# Patient Record
Sex: Female | Born: 1985 | Race: Black or African American | Hispanic: No | Marital: Married | Smoking: Never smoker
Health system: Southern US, Community
[De-identification: ages and names within clinical notes are randomized; demographics above are authoritative.]

## PROBLEM LIST (undated history)

## (undated) ENCOUNTER — Inpatient Hospital Stay (HOSPITAL_COMMUNITY): Payer: Self-pay

## (undated) DIAGNOSIS — R87629 Unspecified abnormal cytological findings in specimens from vagina: Secondary | ICD-10-CM

## (undated) DIAGNOSIS — Z789 Other specified health status: Secondary | ICD-10-CM

## (undated) HISTORY — DX: Unspecified abnormal cytological findings in specimens from vagina: R87.629

## (undated) HISTORY — PX: LEEP: SHX91

---
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
## 2004-02-01 ENCOUNTER — Emergency Department (HOSPITAL_COMMUNITY): Admission: EM | Admit: 2004-02-01 | Discharge: 2004-02-01 | Payer: Self-pay | Admitting: Emergency Medicine

## 2009-05-15 ENCOUNTER — Emergency Department (HOSPITAL_COMMUNITY): Admission: EM | Admit: 2009-05-15 | Discharge: 2009-05-15 | Payer: Self-pay | Admitting: Family Medicine

## 2010-03-19 ENCOUNTER — Ambulatory Visit: Payer: Self-pay | Admitting: Obstetrics and Gynecology

## 2010-03-19 ENCOUNTER — Inpatient Hospital Stay (HOSPITAL_COMMUNITY): Admission: AD | Admit: 2010-03-19 | Discharge: 2010-03-19 | Payer: Self-pay | Admitting: Obstetrics & Gynecology

## 2010-05-08 ENCOUNTER — Ambulatory Visit (HOSPITAL_COMMUNITY): Admission: RE | Admit: 2010-05-08 | Discharge: 2010-05-08 | Payer: Self-pay | Admitting: Obstetrics and Gynecology

## 2010-07-14 ENCOUNTER — Inpatient Hospital Stay (HOSPITAL_COMMUNITY)
Admission: AD | Admit: 2010-07-14 | Discharge: 2010-07-17 | Payer: Self-pay | Source: Home / Self Care | Attending: Obstetrics and Gynecology | Admitting: Obstetrics and Gynecology

## 2010-07-15 ENCOUNTER — Ambulatory Visit (HOSPITAL_COMMUNITY)
Admission: RE | Admit: 2010-07-15 | Discharge: 2010-07-15 | Payer: Self-pay | Source: Home / Self Care | Attending: Obstetrics and Gynecology | Admitting: Obstetrics and Gynecology

## 2010-07-16 ENCOUNTER — Ambulatory Visit (HOSPITAL_COMMUNITY)
Admission: RE | Admit: 2010-07-16 | Discharge: 2010-07-16 | Payer: Self-pay | Source: Home / Self Care | Attending: Obstetrics and Gynecology | Admitting: Obstetrics and Gynecology

## 2010-07-20 ENCOUNTER — Inpatient Hospital Stay (HOSPITAL_COMMUNITY)
Admission: AD | Admit: 2010-07-20 | Discharge: 2010-07-20 | Payer: Self-pay | Source: Home / Self Care | Attending: Obstetrics and Gynecology | Admitting: Obstetrics and Gynecology

## 2010-07-21 ENCOUNTER — Inpatient Hospital Stay (HOSPITAL_COMMUNITY)
Admission: AD | Admit: 2010-07-21 | Discharge: 2010-07-21 | Payer: Self-pay | Source: Home / Self Care | Attending: Obstetrics and Gynecology | Admitting: Obstetrics and Gynecology

## 2010-08-09 ENCOUNTER — Inpatient Hospital Stay (HOSPITAL_COMMUNITY)
Admission: AD | Admit: 2010-08-09 | Discharge: 2010-08-09 | Payer: Self-pay | Source: Home / Self Care | Attending: Obstetrics and Gynecology | Admitting: Obstetrics and Gynecology

## 2010-08-19 ENCOUNTER — Encounter: Payer: Self-pay | Admitting: Obstetrics and Gynecology

## 2010-08-25 ENCOUNTER — Encounter (HOSPITAL_COMMUNITY)
Admission: RE | Admit: 2010-08-25 | Discharge: 2010-08-25 | Disposition: A | Discharge status: Home / Self Care | Payer: 59 | Source: Ambulatory Visit | Attending: Obstetrics and Gynecology | Admitting: Obstetrics and Gynecology

## 2010-08-25 DIAGNOSIS — Z01812 Encounter for preprocedural laboratory examination: Secondary | ICD-10-CM | POA: Insufficient documentation

## 2010-08-25 LAB — SURGICAL PCR SCREEN
MRSA, PCR: NEGATIVE
Staphylococcus aureus: POSITIVE — AB

## 2010-08-25 LAB — CBC
HCT: 39.7 % (ref 36.0–46.0)
Hemoglobin: 13.5 g/dL (ref 12.0–15.0)
MCH: 32.4 pg (ref 26.0–34.0)
MCHC: 34 g/dL (ref 30.0–36.0)
MCV: 95.2 fL (ref 78.0–100.0)
Platelets: 223 10*3/uL (ref 150–400)
RBC: 4.17 MIL/uL (ref 3.87–5.11)
RDW: 14.6 % (ref 11.5–15.5)
WBC: 8.6 10*3/uL (ref 4.0–10.5)

## 2010-08-25 LAB — RPR: RPR Ser Ql: NONREACTIVE

## 2010-09-01 ENCOUNTER — Inpatient Hospital Stay (HOSPITAL_COMMUNITY)
Admission: RE | Admit: 2010-09-01 | Discharge: 2010-09-04 | DRG: 766 | Disposition: A | Discharge status: Home / Self Care | Payer: 59 | Source: Ambulatory Visit | Attending: Obstetrics and Gynecology | Admitting: Obstetrics and Gynecology

## 2010-09-01 DIAGNOSIS — Z01812 Encounter for preprocedural laboratory examination: Secondary | ICD-10-CM

## 2010-09-01 DIAGNOSIS — O321XX0 Maternal care for breech presentation, not applicable or unspecified: Principal | ICD-10-CM | POA: Diagnosis present

## 2010-09-01 NOTE — H&P (Addendum)
  NAMEMAHOGANI, HOLOHAN               ACCOUNT NO.:  [id number]  MEDICAL RECORD NO.:  [id number]          PATIENT TYPE:  AMB  LOCATION:  SDC                           [facility]:  [facility]  PHYSICIAN:  Dineen Kid. Rana Snare, M.D.    DATE OF BIRTH:  07/05/1986  DATE OF ADMISSION:  09/01/2010 DATE OF DISCHARGE:                             HISTORY & PHYSICAL   HISTORY OF PRESENT ILLNESS:  [name] is a 25 year old G1 at 25 weeks' gestational age who presents for primary cesarean section.  Pregnancy has been complicated by a breech presentation, currently 39 weeks.  She presents for delivery.  Her estimated date of confinement is September 08, 2010.  She has negative group B strep.  Recent ultrasounds are normal except for breech presentation.  Her initial care was provided at [facility] Gyne[facility] and she transferred her care to [place] at 18 weeks.  PAST MEDICAL HISTORY:  Negative.  PAST SURGICAL HISTORY:  She has had a LEEP procedure for cervical dysplasia.  MEDICATIONS:  Include prenatal vitamins.  She has no known drug allergies.  PHYSICAL EXAMINATION:  HEART:  Regular rate and rhythm. LUNGS:  Clear to auscultation bilaterally. ABDOMEN:  Gravid, nontender.  Cervix is 2, 50%, and -3. VITAL SIGNS:  Her blood pressure is 120/82.  IMPRESSION AND PLAN:  Intrauterine pregnancy at 39 weeks, breech presentation.  PLAN:  Primary low segment transverse cesarean section.  The risks and benefits of the surgery was discussed at length which include but not limited to risk of infection, bleeding, damage to uterus, tubes, ovaries, bowel, bladder, and fetus.  She does give her informed consent and wished to proceed.     Dineen Kid Rana Snare, M.D.     DCL/MEDQ  D:  08/31/2010  T:  09/01/2010  Job:  [id number]  Electronically Signed by Candice Camp M.D. on 09/01/2010 01:01:28 PM

## 2010-09-02 LAB — CBC
HCT: 31.3 % — ABNORMAL LOW (ref 36.0–46.0)
Hemoglobin: 10.4 g/dL — ABNORMAL LOW (ref 12.0–15.0)
MCH: 31.5 pg (ref 26.0–34.0)
MCHC: 33.2 g/dL (ref 30.0–36.0)
MCV: 94.8 fL (ref 78.0–100.0)
Platelets: 173 10*3/uL (ref 150–400)
RBC: 3.3 MIL/uL — ABNORMAL LOW (ref 3.87–5.11)
RDW: 14.8 % (ref 11.5–15.5)
WBC: 13.8 10*3/uL — ABNORMAL HIGH (ref 4.0–10.5)

## 2010-09-06 ENCOUNTER — Inpatient Hospital Stay (HOSPITAL_COMMUNITY)
Admission: AD | Admit: 2010-09-06 | Discharge: 2010-09-06 | Disposition: A | Discharge status: Home / Self Care | Payer: 59 | Source: Ambulatory Visit | Attending: Obstetrics and Gynecology | Admitting: Obstetrics and Gynecology

## 2010-09-06 DIAGNOSIS — G8918 Other acute postprocedural pain: Secondary | ICD-10-CM | POA: Insufficient documentation

## 2010-09-06 DIAGNOSIS — R109 Unspecified abdominal pain: Secondary | ICD-10-CM | POA: Insufficient documentation

## 2010-09-06 DIAGNOSIS — N949 Unspecified condition associated with female genital organs and menstrual cycle: Secondary | ICD-10-CM

## 2010-09-06 DIAGNOSIS — O99893 Other specified diseases and conditions complicating puerperium: Secondary | ICD-10-CM | POA: Insufficient documentation

## 2010-09-06 DIAGNOSIS — N760 Acute vaginitis: Secondary | ICD-10-CM

## 2010-09-06 DIAGNOSIS — O2390 Unspecified genitourinary tract infection in pregnancy, unspecified trimester: Secondary | ICD-10-CM

## 2010-09-14 NOTE — Op Note (Addendum)
  NAMEKANOE, WANNER               ACCOUNT NO.:  [id number]  MEDICAL RECORD NO.:  [id number]           PATIENT TYPE:  I  LOCATION:  9105                          [facility]:  [facility]  PHYSICIAN:  Dineen Kid. Rana Snare, M.D.    DATE OF BIRTH:  [birth year]  DATE OF PROCEDURE:  09/01/2010 DATE OF DISCHARGE:                              OPERATIVE REPORT   PREOPERATIVE DIAGNOSIS:  Intrauterine at 52 weeks' gestational age, breech presentation.  POSTOPERATIVE DIAGNOSIS:  Intrauterine at 32 weeks' gestational age, breech presentation.  PROCEDURE:  Primary low segment transverse cesarean section.  SURGEON:  Dineen Kid. Rana Snare, MD  ANESTHESIA:  Spinal.  INDICATIONS:  Faith Hanson is a 25 year old G1 at 32 weeks' gestational age.  Pregnancy has been complicated by breech presentation, fetus has been in this position for a long time.  She desires cesarean section and presents for this.  Pregnancy otherwise has been uncomplicated.  Risks and benefits were discussed at length.  Informed consent was obtained.  FINDINGS AT THE TIME OF SURGERY:  Viable female infant, Apgars were 9 and 9, pH arterial 7.35.  DESCRIPTION OF PROCEDURE:  After adequate analgesia, the patient was placed in the supine position with left lateral tilt, sterilely prepped and draped with a Foley catheter was sterilely placed.  A Pfannenstiel skin incision was made 2 fingerbreadths above the pubic symphysis, taken down sharply to the fascia which was incised transversely, extended superiorly and inferiorly off the bellies rectus muscle which was separated sharply in the midline.  Peritoneum was entered sharply. Bladder flap was created and placed behind the bladder blade.  Low segment myotomy incision was made down to the amniotic sac, extended laterally with the operator's fingertips.  Amniotomy was performed. Infant's buttocks was delivered atraumatically.  The baby was in the frank breech presentation.  The arms were easily reduced.   The head was delivered atraumatically.  The nares and pharynx were suctioned.  Cord clamped and cut and the baby was handed to the pediatrician for resuscitation.  Cord blood was then obtained.  Placenta was extracted manually.  Uterus was exteriorized, wiped clean with a dry lap.  The myotomy incision was closed in 2 layers, first being a running locking layer, the second being the imbricating layer of 0 Monocryl suture. Uterus was placed back in to the abdominal cavity.  After copious amount of irrigation, adequate hemostasis was assured.  The peritoneum was then closed with 0 Monocryl suture.  Rectus muscle was plicated in the midline.  Irrigation was applied and after adequate hemostasis, the fascia was closed with 0 Vicryl in a running fashion. Irrigation was applied and after adequate hemostasis, skin stapled, Steri-Strips applied.  The patient tolerated the procedure well, was stable on transfer to recovery room.  Sponge and instrument count was normal x3.  Estimated blood loss 500 mL.  The patient received 1 g of cefotetan preoperatively.     Dineen Kid Rana Snare, M.D.     DCL/MEDQ  D:  09/01/2010  T:  09/02/2010  Job:  [id number]  Electronically Signed by Candice Camp M.D. on 09/14/2010 08:06:37 AM

## 2010-09-28 LAB — CBC
HCT: 38.3 % (ref 36.0–46.0)
Hemoglobin: 13.3 g/dL (ref 12.0–15.0)
MCH: 32.3 pg (ref 26.0–34.0)
MCHC: 34.7 g/dL (ref 30.0–36.0)
MCV: 93 fL (ref 78.0–100.0)
Platelets: 236 10*3/uL (ref 150–400)
RBC: 4.12 MIL/uL (ref 3.87–5.11)
RDW: 14.5 % (ref 11.5–15.5)
WBC: 9.4 10*3/uL (ref 4.0–10.5)

## 2010-09-28 LAB — BASIC METABOLIC PANEL
BUN: 7 mg/dL (ref 6–23)
CO2: 26 mEq/L (ref 19–32)
Calcium: 8.7 mg/dL (ref 8.4–10.5)
Chloride: 103 mEq/L (ref 96–112)
Creatinine, Ser: 0.69 mg/dL (ref 0.4–1.2)
GFR calc Af Amer: >60 mL/min (ref >60)
GFR calc non Af Amer: >60 mL/min (ref >60)
Glucose, Bld: 115 mg/dL — ABNORMAL HIGH (ref 70–99)
Potassium: 4.1 mEq/L (ref 3.5–5.1)
Sodium: 137 mEq/L (ref 135–145)

## 2010-09-28 LAB — URINALYSIS, ROUTINE W REFLEX MICROSCOPIC
Bilirubin Urine: NEGATIVE
Glucose, UA: NEGATIVE mg/dL
Hgb urine dipstick: NEGATIVE
Ketones, ur: NEGATIVE mg/dL
Nitrite: NEGATIVE
Protein, ur: NEGATIVE mg/dL
Specific Gravity, Urine: 1.015 (ref 1.005–1.030)
Urobilinogen, UA: 0.2 mg/dL (ref 0.0–1.0)
pH: 7 (ref 5.0–8.0)

## 2010-09-28 LAB — COMPREHENSIVE METABOLIC PANEL
ALT: 18 U/L (ref 0–35)
AST: 36 U/L (ref 0–37)
Albumin: 2.9 g/dL — ABNORMAL LOW (ref 3.5–5.2)
Alkaline Phosphatase: 109 U/L (ref 39–117)
BUN: 13 mg/dL (ref 6–23)
CO2: 25 mEq/L (ref 19–32)
Calcium: 8.9 mg/dL (ref 8.4–10.5)
Chloride: 100 mEq/L (ref 96–112)
Creatinine, Ser: 0.7 mg/dL (ref 0.4–1.2)
GFR calc Af Amer: >60 mL/min (ref >60)
GFR calc non Af Amer: >60 mL/min (ref >60)
Glucose, Bld: 81 mg/dL (ref 70–99)
Potassium: 5.3 mEq/L — ABNORMAL HIGH (ref 3.5–5.1)
Sodium: 132 mEq/L — ABNORMAL LOW (ref 135–145)
Total Bilirubin: 0.7 mg/dL (ref 0.3–1.2)
Total Protein: 6.3 g/dL (ref 6.0–8.3)

## 2010-09-28 LAB — URIC ACID: Uric Acid, Serum: 3.6 mg/dL (ref 2.4–7.0)

## 2010-10-01 LAB — URINALYSIS, ROUTINE W REFLEX MICROSCOPIC
Bilirubin Urine: NEGATIVE
Glucose, UA: NEGATIVE mg/dL
Hgb urine dipstick: NEGATIVE
Ketones, ur: NEGATIVE mg/dL
Nitrite: NEGATIVE
Protein, ur: NEGATIVE mg/dL
Specific Gravity, Urine: 1.02 (ref 1.005–1.030)
Urobilinogen, UA: 0.2 mg/dL (ref 0.0–1.0)
pH: 7 (ref 5.0–8.0)

## 2010-10-05 ENCOUNTER — Ambulatory Visit (HOSPITAL_COMMUNITY)
Admission: RE | Admit: 2010-10-05 | Discharge: 2010-10-05 | Disposition: A | Discharge status: Home / Self Care | Payer: 59 | Source: Ambulatory Visit | Attending: Obstetrics and Gynecology | Admitting: Obstetrics and Gynecology

## 2010-10-05 DIAGNOSIS — O923 Agalactia: Secondary | ICD-10-CM | POA: Insufficient documentation

## 2010-10-05 NOTE — Discharge Summary (Signed)
  Faith Hanson, Faith Hanson               ACCOUNT NO.:  [id number]  MEDICAL RECORD NO.:  [id number]           PATIENT TYPE:  I  LOCATION:  9105                          [facility]  [facility]  PHYSICIAN:  Guy Sandifer. Henderson Cloud, M.D. DATE OF BIRTH:  [birth year]  DATE OF ADMISSION:  09/01/2010 DATE OF DISCHARGE:  09/04/2010                              DISCHARGE SUMMARY   ADMITTING DIAGNOSIS: 1. Intrauterine pregnancy at 45 weeks' estimated gestational age. 2. Breech presentation.  DISCHARGE DIAGNOSES: 1. Status post low transverse cesarean section. 2. viable female infant.  PROCEDURE:  Primary low transverse cesarean section.  REASON FOR ADMISSION:  Please see dictated H&P.  HOSPITAL COURSE:  The patient is a 25 year old, primigravida, who was admitted to [facility] for scheduled cesarean section. The patient was noted to be in the breech presentation and was scheduled for cesarean delivery.  On the morning of admission, the patient was taken to the operating room where spinal anesthesia was administered without difficulty.  A low transverse incision was made with delivery of a viable female infant with Apgars of 9 at 1 and 9 at 5 minutes. Arterial cord pH was 7.35.  The patient tolerated the procedure well and taken to the recovery room in stable condition.  On postoperative day #1, the patient was without complaint.  Vital signs were stable. Abdomen soft.  Fundus firm and nontender.  Abdominal dressing was noted be clean, dry, and intact.  Foley had been discontinued, however, she had not voided at the time of rounding.  Laboratory findings showed hemoglobin 10.4, blood type is known to be A+.  On postoperative day #2, the patient was without complaint.  Vital signs were stable.  Abdomen soft.  Fundus firm and nontender.  Incision was noted with staples and scant amount of drainage noted on the left margin of the incisional site.  She is voiding well and ambulating without  assistance.  On postoperative day #3, the patient was without complaint.  Vital signs were stable.  Abdomen soft.  Fundus firm and nontender.  Incision was clean, dry, and intact.  Staples were removed and the patient was later discharged home.  CONDITION ON DISCHARGE:  Stable.  DIET:  Regular as tolerated.  ACTIVITY:  No heavy lifting, no driving x2 weeks, no vaginal entry.  FOLLOWUP:  The patient should follow up in the office in 1-2 weeks for an incision check.  She is to call for temperature greater than 100 degrees, persistent nausea and vomiting, heavy vaginal bleeding, and/or redness or drainage from incisional site.  DISCHARGE MEDICATIONS: 1. Tylox, #30, one p.o. every 6 hours. 2. Motrin 600 mg every 6 hours. 3. Prenatal vitamins 1 p.o. daily. 4. Colace 1 p.o. daily p.r.n.     Julio Sicks, N.P.   ______________________________ Guy Sandifer. Henderson Cloud, M.D.    CC/MEDQ  D:  09/04/2010  T:  09/04/2010  Job:  [id number]  Electronically Signed by Julio Sicks N.P. on 10/01/2010 08:34:27 AM Electronically Signed by Harold Hedge M.D. on 10/05/2010 09:29:47 AM

## 2010-10-22 LAB — CBC
HCT: 39.7 % (ref 36.0–46.0)
Hemoglobin: 13.4 g/dL (ref 12.0–15.0)
MCHC: 33.7 g/dL (ref 30.0–36.0)
MCV: 94.7 fL (ref 78.0–100.0)
Platelets: 257 10*3/uL (ref 150–400)
RBC: 4.19 MIL/uL (ref 3.87–5.11)
RDW: 14.6 % (ref 11.5–15.5)
WBC: 8.3 10*3/uL (ref 4.0–10.5)

## 2010-10-22 LAB — POCT I-STAT, CHEM 8
BUN: 11 mg/dL (ref 6–23)
Calcium, Ion: 1.22 mmol/L (ref 1.12–1.32)
Chloride: 103 mEq/L (ref 96–112)
Creatinine, Ser: 0.8 mg/dL (ref 0.4–1.2)
Glucose, Bld: 94 mg/dL (ref 70–99)
HCT: 44 % (ref 36.0–46.0)
Hemoglobin: 15 g/dL (ref 12.0–15.0)
Potassium: 3.5 mEq/L (ref 3.5–5.1)
Sodium: 142 mEq/L (ref 135–145)
TCO2: 27 mmol/L (ref 0–100)

## 2010-10-22 LAB — STOOL CULTURE

## 2010-10-22 LAB — GIARDIA/CRYPTOSPORIDIUM SCREEN(EIA)
Cryptosporidium Screen (EIA): NEGATIVE
Giardia Screen - EIA: NEGATIVE

## 2010-10-22 LAB — SEDIMENTATION RATE: Sed Rate: 5 mm/hr (ref 0–22)

## 2013-07-30 ENCOUNTER — Encounter (HOSPITAL_COMMUNITY): Payer: Self-pay | Admitting: Emergency Medicine

## 2013-07-30 ENCOUNTER — Emergency Department (HOSPITAL_COMMUNITY)
Admission: EM | Admit: 2013-07-30 | Discharge: 2013-07-30 | Disposition: A | Discharge status: Home / Self Care | Payer: 59 | Attending: Emergency Medicine | Admitting: Emergency Medicine

## 2013-07-30 DIAGNOSIS — Y92410 Unspecified street and highway as the place of occurrence of the external cause: Secondary | ICD-10-CM | POA: Insufficient documentation

## 2013-07-30 DIAGNOSIS — R209 Unspecified disturbances of skin sensation: Secondary | ICD-10-CM | POA: Insufficient documentation

## 2013-07-30 DIAGNOSIS — Y9389 Activity, other specified: Secondary | ICD-10-CM | POA: Insufficient documentation

## 2013-07-30 DIAGNOSIS — S0990XA Unspecified injury of head, initial encounter: Secondary | ICD-10-CM | POA: Insufficient documentation

## 2013-07-30 DIAGNOSIS — IMO0002 Reserved for concepts with insufficient information to code with codable children: Secondary | ICD-10-CM | POA: Insufficient documentation

## 2013-07-30 DIAGNOSIS — M549 Dorsalgia, unspecified: Secondary | ICD-10-CM

## 2013-07-30 MED ORDER — DIAZEPAM 5 MG PO TABS: 5.0000 mg | ORAL_TABLET | Freq: Two times a day (BID) | ORAL | Status: DC

## 2013-07-30 MED ORDER — IBUPROFEN 600 MG PO TABS: 600.0000 mg | ORAL_TABLET | Freq: Four times a day (QID) | ORAL | Status: DC | PRN
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
## 2013-07-31 NOTE — ED Provider Notes (Signed)
Medical screening examination/treatment/procedure(s) were performed by non-physician practitioner and as supervising physician I was immediately available for consultation/collaboration.  EKG Interpretation   None       Devoria AlbeIva Ellarae Nevitt, MD, Armando GangFACEP   Ward GivensIva L Jameca Chumley, MD 07/31/13 0005

## 2014-10-18 ENCOUNTER — Emergency Department (HOSPITAL_COMMUNITY)
Admission: EM | Admit: 2014-10-18 | Discharge: 2014-10-18 | Disposition: A | Discharge status: Home / Self Care | Payer: 59 | Source: Home / Self Care | Attending: Emergency Medicine | Admitting: Emergency Medicine

## 2014-10-18 ENCOUNTER — Encounter (HOSPITAL_COMMUNITY): Payer: Self-pay | Admitting: Emergency Medicine

## 2014-10-18 DIAGNOSIS — R070 Pain in throat: Secondary | ICD-10-CM | POA: Diagnosis not present

## 2014-10-18 DIAGNOSIS — R509 Fever, unspecified: Secondary | ICD-10-CM | POA: Diagnosis not present

## 2014-10-18 LAB — POCT RAPID STREP A: Streptococcus, Group A Screen (Direct): NEGATIVE

## 2014-10-18 MED ORDER — KETOROLAC TROMETHAMINE 60 MG/2ML IM SOLN
INTRAMUSCULAR | Status: AC
  Filled 2014-10-18: qty 2

## 2014-10-18 MED ORDER — KETOROLAC TROMETHAMINE 60 MG/2ML IM SOLN
60.0000 mg | Freq: Once | INTRAMUSCULAR | Status: AC
  Administered 2014-10-18: 60 mg via INTRAMUSCULAR

## 2014-10-18 NOTE — ED Provider Notes (Signed)
CSN: [id number]     Arrival date & time 10/18/14  1055 History   First MD Initiated Contact with Patient 10/18/14 1213     Chief Complaint  Patient presents with  . Sore Throat   (Consider location/radiation/quality/duration/timing/severity/associated sxs/prior Treatment) HPI She is a 29 year old woman here for evaluation of throat pain. Her symptoms started 3 days ago with sore throat, worse on the right, throat swelling, fever, chills, body aches, headache. She denies any rhinorrhea or nasal congestion. She does have a mild cough. The pain in her throat is worse if she opens her mouth. She has had difficulty swallowing liquids. She states she had a peritonsillar abscess when she was 29 years old and this feels similar to what she remembers.  History reviewed. No pertinent past medical history. Past Surgical History  Procedure Laterality Date  . Cesarean section     No family history on file. History  Substance Use Topics  . Smoking status: Never Smoker   . Smokeless tobacco: Never Used  . Alcohol Use: Yes     Comment: rarely    OB History    No data available     Review of Systems  Constitutional: Positive for fever, chills and appetite change.  HENT: Positive for sore throat and trouble swallowing. Negative for congestion, rhinorrhea and sinus pressure.   Respiratory: Positive for cough. Negative for shortness of breath.   Gastrointestinal: Negative for nausea and vomiting.  Musculoskeletal: Positive for myalgias.    Allergies  Review of patient's allergies indicates no known allergies.  Home Medications   Prior to Admission medications   Medication Sig Start Date End Date Taking? Authorizing Provider  diazepam (VALIUM) 5 MG tablet Take 1 tablet (5 mg total) by mouth 2 (two) times daily. 07/30/13   Junious SilkHannah Merrell, PA-C  ibuprofen (ADVIL,MOTRIN) 600 MG tablet Take 1 tablet (600 mg total) by mouth every 6 (six) hours as needed. 07/30/13   Junious SilkHannah Merrell, PA-C    norgestimate-ethinyl estradiol (ORTHO-CYCLEN,SPRINTEC,PREVIFEM) 0.25-35 MG-MCG tablet Take 1 tablet by mouth daily.    Historical Provider, MD   BP 112/47 mmHg  Pulse 127  Temp(Src) 101 F (38.3 C) (Oral)  Resp 16  SpO2 99%  LMP 09/23/2014 Physical Exam  Constitutional: She is oriented to person, place, and time. She appears well-developed and well-nourished. She appears distressed (looks ill).  HENT:  Head: Normocephalic and atraumatic.  Mouth/Throat:    Pain with opening her mouth  Neck: Neck supple.  Cardiovascular: Tachycardia present.   Pulmonary/Chest: Effort normal.  Lymphadenopathy:    She has no cervical adenopathy.  Neurological: She is alert and oriented to person, place, and time.    ED Course  Procedures (including critical care time) Labs Review Labs Reviewed  POCT RAPID STREP A ([facility] CARE ONLY)    Imaging Review No results found.   MDM   1. Throat pain   2. Fever, unspecified fever cause    Toradol 60 mg IM given for fever and pain. I am concerned for peritonsillar abscess. [facility] will see the patient as soon as possible this afternoon.    Charm RingsErin J Anniyah Mood, MD 10/18/14 1300

## 2014-10-18 NOTE — Discharge Instructions (Signed)
We gave you a shot of Toradol to help with pain and fever. [facility].

## 2014-10-18 NOTE — ED Notes (Signed)
C/o ST onset 3 days Sx include fevers, chills, HA, odynophagia, BA Taking ibup w/no relief Alert, no signs of acute distress.

## 2014-10-20 LAB — CULTURE, GROUP A STREP: Strep A Culture: POSITIVE — AB

## 2014-10-21 ENCOUNTER — Telehealth (HOSPITAL_COMMUNITY): Payer: Self-pay | Admitting: *Deleted

## 2014-10-21 NOTE — ED Notes (Signed)
Throat culture: Group A strep.  Dr. Piedad ClimesHonig notified and said pt.was sent to ENT and he would have put pt. On antibiotic.  Call pt.  I called Pt. verified x 2 and given result.  Pt.sSaid she had an abscess behind her tonsil and it was drained by the ENT. He put her on Amoxicillin. I told her that would treat the strep throat as well.  She can let her ENT know the result. Dr. Piedad ClimesHonig notified. Faith Hanson, Faith Hanson M 10/21/2014

## 2017-02-10 LAB — OB RESULTS CONSOLE ABO/RH: RH TYPE: POSITIVE

## 2017-02-10 LAB — OB RESULTS CONSOLE HIV ANTIBODY (ROUTINE TESTING): HIV: NONREACTIVE

## 2017-02-10 LAB — OB RESULTS CONSOLE RUBELLA ANTIBODY, IGM: Rubella: IMMUNE

## 2017-02-10 LAB — OB RESULTS CONSOLE GC/CHLAMYDIA
Chlamydia: NEGATIVE
Gonorrhea: NEGATIVE

## 2017-02-10 LAB — OB RESULTS CONSOLE HEPATITIS B SURFACE ANTIGEN: HEP B S AG: NEGATIVE

## 2017-02-10 LAB — OB RESULTS CONSOLE RPR: RPR: NONREACTIVE

## 2017-02-10 LAB — OB RESULTS CONSOLE ANTIBODY SCREEN: Antibody Screen: NEGATIVE

## 2017-05-18 ENCOUNTER — Encounter (HOSPITAL_COMMUNITY): Payer: Self-pay

## 2017-05-18 ENCOUNTER — Other Ambulatory Visit (HOSPITAL_COMMUNITY): Payer: Self-pay | Admitting: Obstetrics and Gynecology

## 2017-05-18 DIAGNOSIS — O4102X0 Oligohydramnios, second trimester, not applicable or unspecified: Secondary | ICD-10-CM

## 2017-05-18 DIAGNOSIS — Z3689 Encounter for other specified antenatal screening: Secondary | ICD-10-CM

## 2017-05-18 DIAGNOSIS — Z3A24 24 weeks gestation of pregnancy: Secondary | ICD-10-CM

## 2017-05-20 ENCOUNTER — Encounter (HOSPITAL_COMMUNITY): Payer: Self-pay

## 2017-05-20 ENCOUNTER — Ambulatory Visit (HOSPITAL_COMMUNITY)
Admission: RE | Admit: 2017-05-20 | Discharge: 2017-05-20 | Disposition: A | Discharge status: Home / Self Care | Payer: 59 | Source: Ambulatory Visit | Attending: Obstetrics and Gynecology | Admitting: Obstetrics and Gynecology

## 2017-05-20 ENCOUNTER — Other Ambulatory Visit (HOSPITAL_COMMUNITY): Payer: Self-pay | Admitting: *Deleted

## 2017-05-20 DIAGNOSIS — Z3A23 23 weeks gestation of pregnancy: Secondary | ICD-10-CM | POA: Diagnosis not present

## 2017-05-20 DIAGNOSIS — O34219 Maternal care for unspecified type scar from previous cesarean delivery: Secondary | ICD-10-CM | POA: Diagnosis not present

## 2017-05-20 DIAGNOSIS — O4102X0 Oligohydramnios, second trimester, not applicable or unspecified: Secondary | ICD-10-CM | POA: Diagnosis not present

## 2017-05-20 DIAGNOSIS — O4192X0 Disorder of amniotic fluid and membranes, unspecified, second trimester, not applicable or unspecified: Secondary | ICD-10-CM

## 2017-05-20 DIAGNOSIS — Z3A24 24 weeks gestation of pregnancy: Secondary | ICD-10-CM

## 2017-05-20 DIAGNOSIS — Z3689 Encounter for other specified antenatal screening: Secondary | ICD-10-CM | POA: Insufficient documentation

## 2017-05-20 HISTORY — DX: Other specified health status: Z78.9

## 2017-05-24 ENCOUNTER — Encounter (HOSPITAL_COMMUNITY): Payer: Self-pay

## 2017-05-24 ENCOUNTER — Other Ambulatory Visit: Payer: Self-pay

## 2017-06-03 ENCOUNTER — Other Ambulatory Visit (HOSPITAL_COMMUNITY): Payer: Self-pay | Admitting: Maternal & Fetal Medicine

## 2017-06-03 ENCOUNTER — Encounter (HOSPITAL_COMMUNITY): Payer: Self-pay

## 2017-06-03 ENCOUNTER — Ambulatory Visit (HOSPITAL_COMMUNITY)
Admission: RE | Admit: 2017-06-03 | Discharge: 2017-06-03 | Disposition: A | Discharge status: Home / Self Care | Payer: 59 | Source: Ambulatory Visit | Attending: Obstetrics and Gynecology | Admitting: Obstetrics and Gynecology

## 2017-06-03 DIAGNOSIS — O4192X0 Disorder of amniotic fluid and membranes, unspecified, second trimester, not applicable or unspecified: Secondary | ICD-10-CM | POA: Diagnosis not present

## 2017-06-03 DIAGNOSIS — Z3A25 25 weeks gestation of pregnancy: Secondary | ICD-10-CM | POA: Insufficient documentation

## 2017-06-03 DIAGNOSIS — Z362 Encounter for other antenatal screening follow-up: Secondary | ICD-10-CM | POA: Diagnosis not present

## 2017-06-03 DIAGNOSIS — O365921 Maternal care for other known or suspected poor fetal growth, second trimester, fetus 1: Secondary | ICD-10-CM

## 2017-06-03 DIAGNOSIS — O34219 Maternal care for unspecified type scar from previous cesarean delivery: Secondary | ICD-10-CM | POA: Diagnosis not present

## 2017-06-03 DIAGNOSIS — O365920 Maternal care for other known or suspected poor fetal growth, second trimester, not applicable or unspecified: Secondary | ICD-10-CM | POA: Insufficient documentation

## 2017-06-03 DIAGNOSIS — Z98891 History of uterine scar from previous surgery: Secondary | ICD-10-CM

## 2017-06-03 DIAGNOSIS — O4102X0 Oligohydramnios, second trimester, not applicable or unspecified: Secondary | ICD-10-CM

## 2017-06-06 ENCOUNTER — Other Ambulatory Visit (HOSPITAL_COMMUNITY): Payer: Self-pay | Admitting: *Deleted

## 2017-06-17 ENCOUNTER — Other Ambulatory Visit (HOSPITAL_COMMUNITY): Payer: Self-pay | Admitting: Maternal and Fetal Medicine

## 2017-06-17 ENCOUNTER — Ambulatory Visit (HOSPITAL_COMMUNITY)
Admission: RE | Admit: 2017-06-17 | Discharge: 2017-06-17 | Disposition: A | Discharge status: Home / Self Care | Payer: 59 | Source: Ambulatory Visit | Attending: Obstetrics and Gynecology | Admitting: Obstetrics and Gynecology

## 2017-06-17 ENCOUNTER — Encounter (HOSPITAL_COMMUNITY): Payer: Self-pay

## 2017-06-17 DIAGNOSIS — O365920 Maternal care for other known or suspected poor fetal growth, second trimester, not applicable or unspecified: Secondary | ICD-10-CM | POA: Diagnosis not present

## 2017-06-17 DIAGNOSIS — O34219 Maternal care for unspecified type scar from previous cesarean delivery: Secondary | ICD-10-CM | POA: Diagnosis not present

## 2017-06-17 DIAGNOSIS — Z3A27 27 weeks gestation of pregnancy: Secondary | ICD-10-CM | POA: Diagnosis not present

## 2017-06-17 DIAGNOSIS — O4100X0 Oligohydramnios, unspecified trimester, not applicable or unspecified: Secondary | ICD-10-CM | POA: Diagnosis present

## 2017-06-17 DIAGNOSIS — O4102X0 Oligohydramnios, second trimester, not applicable or unspecified: Secondary | ICD-10-CM | POA: Insufficient documentation

## 2017-06-17 DIAGNOSIS — Z98891 History of uterine scar from previous surgery: Secondary | ICD-10-CM

## 2017-06-17 DIAGNOSIS — Z362 Encounter for other antenatal screening follow-up: Secondary | ICD-10-CM

## 2017-06-17 MED ORDER — BETAMETHASONE SOD PHOS & ACET 6 (3-3) MG/ML IJ SUSP
12.0000 mg | Freq: Once | INTRAMUSCULAR | Status: AC
  Administered 2017-06-17: 12 mg via INTRAMUSCULAR
  Filled 2017-06-17: qty 2

## 2017-06-18 ENCOUNTER — Inpatient Hospital Stay (HOSPITAL_COMMUNITY)
Admission: AD | Admit: 2017-06-18 | Discharge: 2017-06-18 | Disposition: A | Discharge status: Home / Self Care | Payer: 59 | Source: Ambulatory Visit | Attending: Obstetrics and Gynecology | Admitting: Obstetrics and Gynecology

## 2017-06-18 ENCOUNTER — Other Ambulatory Visit: Payer: Self-pay

## 2017-06-18 DIAGNOSIS — O365920 Maternal care for other known or suspected poor fetal growth, second trimester, not applicable or unspecified: Secondary | ICD-10-CM | POA: Diagnosis present

## 2017-06-18 DIAGNOSIS — O4100X0 Oligohydramnios, unspecified trimester, not applicable or unspecified: Secondary | ICD-10-CM | POA: Insufficient documentation

## 2017-06-18 DIAGNOSIS — Z3A30 30 weeks gestation of pregnancy: Secondary | ICD-10-CM | POA: Diagnosis not present

## 2017-06-18 MED ORDER — BETAMETHASONE SOD PHOS & ACET 6 (3-3) MG/ML IJ SUSP
12.0000 mg | Freq: Once | INTRAMUSCULAR | Status: AC
  Administered 2017-06-18: 12 mg via INTRAMUSCULAR
  Filled 2017-06-18: qty 2

## 2017-06-18 NOTE — Progress Notes (Signed)
Pt discharged home ambulatory in stable condition after receiving 2nd dose BMZ.  No rxn noted.

## 2017-06-18 NOTE — MAU Note (Signed)
Pt presents for 2nd dose of BMZ.  Reports no other complaints.

## 2017-06-20 ENCOUNTER — Other Ambulatory Visit (HOSPITAL_COMMUNITY): Payer: Self-pay | Admitting: *Deleted

## 2017-06-20 DIAGNOSIS — O365930 Maternal care for other known or suspected poor fetal growth, third trimester, not applicable or unspecified: Secondary | ICD-10-CM

## 2017-06-24 ENCOUNTER — Encounter (HOSPITAL_COMMUNITY): Payer: Self-pay

## 2017-06-24 ENCOUNTER — Other Ambulatory Visit (HOSPITAL_COMMUNITY): Payer: Self-pay | Admitting: Maternal and Fetal Medicine

## 2017-06-24 ENCOUNTER — Ambulatory Visit (HOSPITAL_COMMUNITY)
Admission: RE | Admit: 2017-06-24 | Discharge: 2017-06-24 | Disposition: A | Discharge status: Home / Self Care | Payer: 59 | Source: Ambulatory Visit | Attending: Obstetrics and Gynecology | Admitting: Obstetrics and Gynecology

## 2017-06-24 DIAGNOSIS — Z3A28 28 weeks gestation of pregnancy: Secondary | ICD-10-CM

## 2017-06-24 DIAGNOSIS — O365930 Maternal care for other known or suspected poor fetal growth, third trimester, not applicable or unspecified: Secondary | ICD-10-CM

## 2017-06-24 DIAGNOSIS — O34219 Maternal care for unspecified type scar from previous cesarean delivery: Secondary | ICD-10-CM | POA: Insufficient documentation

## 2017-06-24 DIAGNOSIS — O4103X0 Oligohydramnios, third trimester, not applicable or unspecified: Secondary | ICD-10-CM | POA: Diagnosis not present

## 2017-06-24 NOTE — Addendum Note (Signed)
Encounter addended by: Marcellina MillinMoser, Cloma Rahrig L, RTR on: 06/24/2017 3:04 PM  Actions taken: Imaging Exam ended

## 2017-06-27 ENCOUNTER — Other Ambulatory Visit (HOSPITAL_COMMUNITY): Payer: Self-pay | Admitting: Obstetrics and Gynecology

## 2017-06-27 DIAGNOSIS — O4100X0 Oligohydramnios, unspecified trimester, not applicable or unspecified: Secondary | ICD-10-CM

## 2017-06-27 DIAGNOSIS — O365930 Maternal care for other known or suspected poor fetal growth, third trimester, not applicable or unspecified: Secondary | ICD-10-CM

## 2017-06-27 DIAGNOSIS — Z3A29 29 weeks gestation of pregnancy: Secondary | ICD-10-CM

## 2017-06-28 ENCOUNTER — Ambulatory Visit (HOSPITAL_COMMUNITY)
Admission: RE | Admit: 2017-06-28 | Discharge: 2017-06-28 | Disposition: A | Discharge status: Home / Self Care | Payer: 59 | Source: Ambulatory Visit | Attending: Obstetrics and Gynecology | Admitting: Obstetrics and Gynecology

## 2017-06-28 ENCOUNTER — Other Ambulatory Visit (HOSPITAL_COMMUNITY): Payer: Self-pay | Admitting: *Deleted

## 2017-06-28 ENCOUNTER — Encounter (HOSPITAL_COMMUNITY): Payer: Self-pay

## 2017-06-28 DIAGNOSIS — Z3A29 29 weeks gestation of pregnancy: Secondary | ICD-10-CM | POA: Insufficient documentation

## 2017-06-28 DIAGNOSIS — O34219 Maternal care for unspecified type scar from previous cesarean delivery: Secondary | ICD-10-CM | POA: Diagnosis not present

## 2017-06-28 DIAGNOSIS — Z3689 Encounter for other specified antenatal screening: Secondary | ICD-10-CM | POA: Diagnosis present

## 2017-06-28 DIAGNOSIS — O4103X0 Oligohydramnios, third trimester, not applicable or unspecified: Secondary | ICD-10-CM | POA: Insufficient documentation

## 2017-06-28 DIAGNOSIS — O365930 Maternal care for other known or suspected poor fetal growth, third trimester, not applicable or unspecified: Secondary | ICD-10-CM

## 2017-06-28 DIAGNOSIS — O365933 Maternal care for other known or suspected poor fetal growth, third trimester, fetus 3: Secondary | ICD-10-CM | POA: Diagnosis not present

## 2017-06-28 DIAGNOSIS — O09893 Supervision of other high risk pregnancies, third trimester: Secondary | ICD-10-CM | POA: Insufficient documentation

## 2017-06-28 DIAGNOSIS — O4190X0 Disorder of amniotic fluid and membranes, unspecified, unspecified trimester, not applicable or unspecified: Secondary | ICD-10-CM

## 2017-06-28 DIAGNOSIS — O4100X0 Oligohydramnios, unspecified trimester, not applicable or unspecified: Secondary | ICD-10-CM

## 2017-06-30 ENCOUNTER — Encounter (HOSPITAL_COMMUNITY): Payer: Self-pay

## 2017-06-30 ENCOUNTER — Ambulatory Visit (HOSPITAL_COMMUNITY)
Admission: RE | Admit: 2017-06-30 | Discharge: 2017-06-30 | Disposition: A | Discharge status: Home / Self Care | Payer: 59 | Source: Ambulatory Visit | Attending: Obstetrics and Gynecology | Admitting: Obstetrics and Gynecology

## 2017-06-30 ENCOUNTER — Other Ambulatory Visit (HOSPITAL_COMMUNITY): Payer: Self-pay | Admitting: Maternal and Fetal Medicine

## 2017-06-30 ENCOUNTER — Other Ambulatory Visit: Payer: Self-pay

## 2017-06-30 ENCOUNTER — Inpatient Hospital Stay (HOSPITAL_COMMUNITY)
Admission: AD | Admit: 2017-06-30 | Discharge: 2017-07-11 | DRG: 833 | Disposition: A | Discharge status: Home / Self Care | Payer: 59 | Source: Ambulatory Visit | Attending: Obstetrics and Gynecology | Admitting: Obstetrics and Gynecology

## 2017-06-30 ENCOUNTER — Encounter (HOSPITAL_COMMUNITY): Payer: Self-pay | Admitting: *Deleted

## 2017-06-30 DIAGNOSIS — O4103X1 Oligohydramnios, third trimester, fetus 1: Secondary | ICD-10-CM

## 2017-06-30 DIAGNOSIS — Z3A29 29 weeks gestation of pregnancy: Secondary | ICD-10-CM | POA: Insufficient documentation

## 2017-06-30 DIAGNOSIS — O4103X0 Oligohydramnios, third trimester, not applicable or unspecified: Secondary | ICD-10-CM | POA: Insufficient documentation

## 2017-06-30 DIAGNOSIS — O365931 Maternal care for other known or suspected poor fetal growth, third trimester, fetus 1: Secondary | ICD-10-CM

## 2017-06-30 DIAGNOSIS — O365930 Maternal care for other known or suspected poor fetal growth, third trimester, not applicable or unspecified: Secondary | ICD-10-CM

## 2017-06-30 DIAGNOSIS — Z3A31 31 weeks gestation of pregnancy: Secondary | ICD-10-CM

## 2017-06-30 DIAGNOSIS — O4100X0 Oligohydramnios, unspecified trimester, not applicable or unspecified: Secondary | ICD-10-CM

## 2017-06-30 DIAGNOSIS — O4192X0 Disorder of amniotic fluid and membranes, unspecified, second trimester, not applicable or unspecified: Secondary | ICD-10-CM

## 2017-06-30 DIAGNOSIS — Z3A30 30 weeks gestation of pregnancy: Secondary | ICD-10-CM

## 2017-06-30 DIAGNOSIS — O34219 Maternal care for unspecified type scar from previous cesarean delivery: Secondary | ICD-10-CM

## 2017-06-30 LAB — TYPE AND SCREEN
ABO/RH(D): A POS
ANTIBODY SCREEN: NEGATIVE

## 2017-06-30 LAB — AMNISURE RUPTURE OF MEMBRANE (ROM) NOT AT ARMC: Amnisure ROM: NEGATIVE

## 2017-06-30 MED ORDER — ZOLPIDEM TARTRATE 5 MG PO TABS
5.0000 mg | ORAL_TABLET | Freq: Every evening | ORAL | Status: DC | PRN
  Administered 2017-07-10: 5 mg via ORAL

## 2017-06-30 MED ORDER — DOCUSATE SODIUM 100 MG PO CAPS
100.0000 mg | ORAL_CAPSULE | Freq: Every day | ORAL | Status: DC
  Administered 2017-07-01 – 2017-07-11 (×11): 100 mg via ORAL
  Filled 2017-06-30 (×10): qty 1

## 2017-06-30 MED ORDER — ACETAMINOPHEN 325 MG PO TABS
650.0000 mg | ORAL_TABLET | ORAL | Status: DC | PRN
  Administered 2017-07-06: 650 mg via ORAL
  Filled 2017-06-30: qty 2

## 2017-06-30 MED ORDER — PRENATAL MULTIVITAMIN CH
1.0000 | ORAL_TABLET | Freq: Every day | ORAL | Status: DC
  Administered 2017-07-01 – 2017-07-11 (×11): 1 via ORAL
  Filled 2017-06-30 (×10): qty 1

## 2017-06-30 MED ORDER — SODIUM CHLORIDE 0.9% FLUSH
3.0000 mL | Freq: Two times a day (BID) | INTRAVENOUS | Status: DC
  Administered 2017-06-30 – 2017-07-07 (×7): 3 mL via INTRAVENOUS

## 2017-06-30 MED ORDER — SODIUM CHLORIDE 0.9 % IV SOLN: 250.0000 mL | INTRAVENOUS | Status: DC | PRN

## 2017-06-30 MED ORDER — SODIUM CHLORIDE 0.9% FLUSH
3.0000 mL | INTRAVENOUS | Status: DC | PRN
Start: 2017-06-30 — End: 2017-07-07
  Administered 2017-07-03: 3 mL via INTRAVENOUS
  Filled 2017-06-30: qty 3

## 2017-06-30 MED ORDER — CALCIUM CARBONATE ANTACID 500 MG PO CHEW
2.0000 | CHEWABLE_TABLET | ORAL | Status: DC | PRN
  Administered 2017-07-01 – 2017-07-09 (×3): 400 mg via ORAL
  Filled 2017-06-30 (×3): qty 2

## 2017-06-30 NOTE — MAU Note (Signed)
Amniosure performed and sent to lab - per Dr. Renaldo FiddlerAdkins

## 2017-06-30 NOTE — ED Notes (Signed)
Report called to K. Andrey CampanileWilson, RN.  Pt to MAU for evaluation of PROM.

## 2017-06-30 NOTE — MAU Provider Note (Signed)
Patient Faith Hanson is a 31 y.o. G2P1001 At 1338w3d here with oligohydramnios possible anhydramnios. She endorses positive fetal movements, no leaking of fluid and no vaginal bleeding.  History     CSN: [id number]  Arrival date and time: 06/30/17 1509   None     Chief Complaint  Patient presents with  . Rupture of Membranes    Possible rupture of membrane - sent over from MFM   HPI Patient Faith Hanson is here after an NST and BPP at MFM showed she had no fluid around the baby. She also endorses some abdominal pain which she attributes to gas and bloating. She also thinks that her baby's position is causing her to feel some abdominal discomfort as he is very low in the pelvis.   OB History    Gravida Para Term Preterm AB Living   2 1 1     1    SAB TAB Ectopic Multiple Live Births           1      Past Medical History:  Diagnosis Date  . Medical history non-contributory     Past Surgical History:  Procedure Laterality Date  . CESAREAN SECTION      History reviewed. No pertinent family history.  Social History   Tobacco Use  . Smoking status: Never Smoker  . Smokeless tobacco: Never Used  Substance Use Topics  . Alcohol use: Yes    Comment: rarely   . Drug use: No    Allergies: No Known Allergies  Medications Prior to Admission  Medication Sig Dispense Refill Last Dose  . acetaminophen (TYLENOL) 325 MG tablet Take 650 mg by mouth every 6 (six) hours as needed for mild pain.   06/29/2017 at Unknown time  . Prenatal Vit-Fe Fumarate-FA (PRENATAL VITAMIN PO) Take by mouth.   Past Week at Unknown time    Review of Systems  Constitutional: Negative.   HENT: Negative.   Respiratory: Negative.   Cardiovascular: Negative.   Gastrointestinal: Positive for abdominal pain.  Genitourinary: Negative.   Musculoskeletal: Negative.   Neurological: Negative.   Hematological: Negative.    Physical Exam   Blood pressure (!) 130/56, pulse 95, temperature 98.5  F (36.9 C), temperature source Oral, resp. rate 18, height 5\' 7"  (1.702 m), weight 228 lb (103.4 kg), last menstrual period 12/06/2016.  Physical Exam  Constitutional: She is oriented to person, place, and time. She appears well-developed and well-nourished.  HENT:  Head: Normocephalic.  Eyes: Pupils are equal, round, and reactive to light.  Neck: Normal range of motion.  Respiratory: Effort normal.  GI: Soft. Bowel sounds are normal.  Musculoskeletal: Normal range of motion.  Neurological: She is alert and oriented to person, place, and time.  Skin: Skin is warm and dry.  Psychiatric: She has a normal mood and affect.    MAU Course  Procedures  MDM -Amnisure negative; Dr. Renaldo FiddlerAdkins at the bedside to discuss inpatient vs. Outpatient management.  -NST: 145 bpm, mod var, present acel, neg decel, no contractions.  Assessment and Plan  -plan for admission  Marylene LandKathryn Lorraine Manu Rubey 06/30/2017, 4:17 PM

## 2017-06-30 NOTE — H&P (Signed)
Faith Hanson is a 31 y.o. female G2P1 2 29+2 weeks presenting for admission with anhydramnios.  Pt was noted to have low fluid on routine anatomy scan and has been followed by MFM 2-3x/week.  US today shows no am[facility]otic fluid, [facility], normal dopplers (per MFM phone call - report pending).  US 2 days ago, AFI was  2.5 (<3%).    Pt denies ctx, vb and LOF.  Reports good FM.  S/p BMZ 11/30 & 12/1.   OB History    Gravida Para Term Preterm AB Living   2 1 1     1    SAB TAB Ectopic Multiple Live Births           1     Past Medical History:  Diagnosis Date  . Medical history non-contributory    Past Surgical History:  Procedure Laterality Date  . CESAREAN SECTION     Family History: family history is not on file. Social History:  reports that  has never smoked. she has never used smokeless tobacco. She reports that she drinks alcohol. She reports that she does not use drugs.   ROS History   Blood pressure (!) 130/56, pulse 95, temperature 98.5 F (36.9 C), temperature source Oral, resp. rate 18, height 5\' 7"  (1.702 m), weight 228 lb (103.4 kg), last menstrual period 12/06/2016. Exam Physical Exam  Gen - NAD Abd - gravid, NT Ext - NT, no edema PV - deferred  MFM US report pending Amniosure and fern testing negative  Assessment/Plan: Anhydramnios Pt offered inpatient management with continuous monitoring vs outpt monitoring with continued close MFM f/u Pt elects for admission with continuous fetal monitoring Plan for repeat c-section if signs of fetal distress Repeat US w/ MFM Monday to re-evaluate      Faith Hanson 06/30/2017, 6:00 PM

## 2017-06-30 NOTE — MAU Note (Signed)
Pt. Ready for transfer to [id number].

## 2017-06-30 NOTE — Procedures (Signed)
Faith LauthShontee Hanson 06/15/1986 6673w3d  Fetus A Non-Stress Test Interpretation for 06/30/17  Indication: Oligohydramnios  Fetal Heart Rate A Mode: External Baseline Rate (A): 135 bpm Variability: Moderate Accelerations: 15 x 15 Decelerations: None Multiple birth?: No  Uterine Activity Mode: Toco Contraction Frequency (min): one UC with mild UI noted Contraction Duration (sec): 20-60 Contraction Quality: Mild Resting Tone Palpated: Relaxed Resting Time: Adequate  Interpretation (Fetal Testing) Nonstress Test Interpretation: Reactive Comments: FHR tracing rev'd by Dr. Vincenza HewsQuinn

## 2017-06-30 NOTE — MAU Note (Signed)
Pt. Sent from MFM for SROM - due to decreased fluid level.  EFM applied - FHR 140s, toco applied - abd. Soft. Positive for fetal movement, pt. Denies sudden gush of fluid, denies vaginal bleeding. No pain 0/10.

## 2017-07-01 ENCOUNTER — Ambulatory Visit (HOSPITAL_COMMUNITY): Payer: 59

## 2017-07-01 LAB — ABO/RH: ABO/RH(D): A POS

## 2017-07-01 NOTE — Progress Notes (Signed)
Patient ID: Faith Hanson, female   DOB: 06/27/1986, 31 y.o.   MRN: [id number] Pt without complaints GFM No LOF  VSSAF FHR 140s accels Cat 1 Ctxs none  Abd Gravid nt  IUP at 29 4/7 Severe Oligohydraminos, poss IUGR with normal doppler studies Continue Continuous EFM, IVF FU with US in 3 days S/P BMZ and normal NIPT DL

## 2017-07-01 NOTE — Progress Notes (Signed)
[facility] NST  31 yr old G2P1001 at 29+ weeks with anhydramnios for NST  Normal baseline + accelerations; no decelerations  Reactive NST  Continue antenatal testing  Eulis FosterKristen Traye Bates, MD

## 2017-07-02 MED ORDER — LACTATED RINGERS IV BOLUS (SEPSIS)
500.0000 mL | Freq: Once | INTRAVENOUS | Status: AC
  Administered 2017-07-02: 500 mL via INTRAVENOUS

## 2017-07-02 MED ORDER — LACTATED RINGERS IV SOLN
INTRAVENOUS | Status: DC
  Administered 2017-07-02 – 2017-07-07 (×11): via INTRAVENOUS

## 2017-07-02 NOTE — Progress Notes (Signed)
Patient ID: Faith LauthShontee Hanson, female   DOB: 03/03/1986, 31 y.o.   MRN: [id number] Pt reports GFM No LOF VSSAF FHR 145 no decels occas accels Ctxs Rare  Abd Gravid, nt  IUP at 29 5/7 Severe Oligohydraminos, poss IUGR with normal doppler studies Continue Continuous EFM, IVF FU with US in 3 days S/P BMZ and normal NIPT DL

## 2017-07-03 NOTE — Progress Notes (Signed)
Patient ID: Faith Hanson, female   DOB: 06/17/1986, 31 y.o.   MRN: [id number] Pt reports GFM No LOF VSSAF FHR 145 had 3 minute decel yesterday that responded to position change and IVF bolus Ctxs Rare  Abd Gravid, nt  IUP at 29 6/7 Severe Oligohydraminos, poss IUGR with normal doppler studies Continue Continuous EFM, IVF FU with US with MFM tomorrow S/P BMZ and normal NIPT DL

## 2017-07-03 NOTE — Plan of Care (Signed)
POC discussed with pt.  No questions or concerns

## 2017-07-04 ENCOUNTER — Inpatient Hospital Stay (HOSPITAL_COMMUNITY): Payer: 59

## 2017-07-04 LAB — CBC
HEMATOCRIT: 35.1 % — AB (ref 36.0–46.0)
HEMOGLOBIN: 11.3 g/dL — AB (ref 12.0–15.0)
MCH: 30 pg (ref 26.0–34.0)
MCHC: 32.2 g/dL (ref 30.0–36.0)
MCV: 93.1 fL (ref 78.0–100.0)
Platelets: 266 10*3/uL (ref 150–400)
RBC: 3.77 MIL/uL — ABNORMAL LOW (ref 3.87–5.11)
RDW: 14.4 % (ref 11.5–15.5)
WBC: 9.2 10*3/uL (ref 4.0–10.5)

## 2017-07-04 LAB — TYPE AND SCREEN
ABO/RH(D): A POS
Antibody Screen: NEGATIVE

## 2017-07-04 NOTE — Progress Notes (Addendum)
No current c/o.  Active FM.  No LOF or VB.  Occasional mild CTX.  VSS. AF. MFM u/s this am prelim report called in to me.  Tech reports BPP 4/8 (2 off for fluid and breathing).  She reported active FM and intermittent breathing but not sustained.  Dopplers wnl. FHT Cat I  Gen: A&O x 3 Abd: soft, NT Ext: no c/c/e  31yo G2P1001 at 2376w0d with olighydramnios, AC lag -F/U MFM report -CEFM -s/p BMZ -Continue inpt monitoring  Mitchel HonourMegan Hoang Reich, DO

## 2017-07-04 NOTE — Care Management (Signed)
CM received call regarding patient had need for medication assistance.  CM saw patient.  Patient states she has 2 insurance coverage's  and does not have a need for any medication assistance.  Patient denies any other needs.

## 2017-07-04 NOTE — Progress Notes (Signed)
Pt off FHM , PIV NSL,transported to US via [facility]. We will resume same upon her return.

## 2017-07-04 NOTE — Progress Notes (Signed)
CSW acknowledges consult and contacted Case Management Dept for medication assistance.   Blaine HamperAngel Boyd-Gilyard, MSW, LCSW Clinical Social Work [phone]

## 2017-07-05 NOTE — Progress Notes (Signed)
Possibly tracing maternal HR on EFM from 07:10 to 07:37. Adjusted monitor.

## 2017-07-05 NOTE — Progress Notes (Signed)
Pt requested to be taken off the monitor to shower. Will call when she is ready to be put back on the monitor.

## 2017-07-05 NOTE — Progress Notes (Signed)
784w1d   S// no c/o  O// BP 101/60 (BP Location: Left Arm)   Pulse 91   Temp 98.6 F (37 C) (Oral)   Resp 18   Ht 5\' 7"  (1.702 m)   Wt 228 lb (103.4 kg)   LMP 12/06/2016   SpO2 99%   BMI 35.71 kg/m    MFM US YEST>>>  Impression  IUP at 30+0 weeks with low fetal abdominal circumfrence and  oligohydramnios  Normal fetal movement and cardiac activity  Amniotic fluid low, but much improved, with AFI 7cm and  MVP 2.8cm  BPP 6/8 with no fetal breathing noted  Umbilical artery dopplers normal with no absent or reveresed  end-diastolic flow  A+P//5884w1d  OLIGO, NL doppler studies with some improvement in AFI  Will cont EFM and f/u US 2 days for AFI

## 2017-07-06 NOTE — Progress Notes (Signed)
Patient is resting. She reports baby is active and denies any bleeding or leaking.  BP (!) 110/58 (BP Location: Right Arm)   Pulse 85   Temp 98.4 F (36.9 C) (Oral)   Resp 17   Ht 5\' 7"  (1.702 m)   Wt 103.4 kg (228 lb)   LMP 12/06/2016   SpO2 99%   BMI 35.71 kg/m  No results found for this or any previous visit (from the past 24 hour(s)). Abdomen is soft and non tender  FHR Category 1  IUP at 30 w 2 days  IUGR Anhydramnios  PLAN TO KEEP IN HOUSE UNTIL DELIVERY  Ultrasound tomorrow with BPP/ dopplers Continuous monitoring Patient counseled to notify nurse immediately if Fetal movement changes

## 2017-07-06 NOTE — Progress Notes (Signed)
Dr Vincente PoliGrewal called regarding EFM orders.   New orders: NSTqS during the day, continuous during the day.

## 2017-07-07 ENCOUNTER — Inpatient Hospital Stay (HOSPITAL_COMMUNITY): Payer: 59

## 2017-07-07 LAB — TYPE AND SCREEN
ABO/RH(D): A POS
ANTIBODY SCREEN: NEGATIVE

## 2017-07-07 NOTE — Progress Notes (Signed)
Initial Nutrition Assessment  DOCUMENTATION CODES:   Obesity unspecified  INTERVENTION:  Regular diet May order double protein portions, snacks TID and from retail  NUTRITION DIAGNOSIS:   Increased nutrient needs related to (pregnancy and fetal growth requirements) as evidenced by (30 weeks IUP).  GOAL:   Patient will meet greater than or equal to 90% of their needs, Weight gain  MONITOR:  Weight trends  REASON FOR ASSESSMENT:  Antenatal   ASSESSMENT:   30 3/7 weeks, anhydramnios. weight at 18 weeks, 224 lbs, BMI 35.2  Reports good appetite/ tol well    Diet Order:  Diet regular Room service appropriate? Yes; Fluid consistency: Thin  EDUCATION NEEDS: none identified   Skin:  Skin Assessment: Reviewed RN Assessment Height:   Ht Readings from Last 1 Encounters:  06/30/17 5\' 7"  (1.702 m)    Weight:   Wt Readings from Last 1 Encounters:  06/30/17 228 lb (103.4 kg)    Ideal Body Weight:   135 lbs  BMI:  Body mass index is 35.71 kg/m.  Estimated Nutritional Needs:   Kcal:  2200-2400  Protein:  95-105 g  Fluid:  2.5 l    Elisabeth CaraKatherine Koah Chisenhall M.Odis LusterEd. R.D. LDN Neonatal Nutrition Support Specialist/RD III Pager [phone]      Phone [phone]

## 2017-07-07 NOTE — Progress Notes (Signed)
30 3/7 wks No leaking/bleeding  Vitals:   07/07/17 0433 07/07/17 0845  BP: (!) 109/55 114/61  Pulse: 87 85  Resp: 17 18  Temp: 98.2 F (36.8 C) 98.2 F (36.8 C)  SpO2: 100% 100%    Uterus soft/NT FHT cat one  U/S today-I D/W Dr Amie Portlandecker-AFI 6.5, BPP 6/8 (+breathing motions noted-just not sustained for 30 sec), dopplers normal  A/P: Oligohydramnios         IUGR         D/W patient-will stop IV fluids, NST q shift

## 2017-07-08 ENCOUNTER — Inpatient Hospital Stay (HOSPITAL_COMMUNITY): Payer: 59

## 2017-07-08 ENCOUNTER — Ambulatory Visit (HOSPITAL_COMMUNITY)
Admission: RE | Admit: 2017-07-08 | Discharge: 2017-07-08 | Disposition: A | Discharge status: Home / Self Care | Payer: 59 | Source: Ambulatory Visit | Attending: Obstetrics and Gynecology | Admitting: Obstetrics and Gynecology

## 2017-07-08 ENCOUNTER — Encounter (HOSPITAL_COMMUNITY): Payer: Self-pay

## 2017-07-08 NOTE — Consult Note (Signed)
Neonatology Consult  Note:  At the request of the patients obstetrician Dr. Royston Sinner I met with Faith Hanson and her husband.  She is currently at 30 4 weeks with pregnancy complicated by oligohydramnios and IUGR.  S/p BMZ 11/30-12/1.  Currently getting BPP daily and NST q shift.  We reviewed initial delivery room management, including CPAP, [place], and possible need for intubation for surfactant administration.  Discussed risk for IVH with potential for motor / cognitive deficits, NEC as well as temperature instability and feeding immaturity.  Discussed NG / OG feeds, benefits of MBM in reducing incidence of NEC.   Discussed likely length of stay.  Thank you for allowing [place] to participate in her care.  Please call with questions.  Higinio Roger, DO  Neonatologist   The total length of face-to-face or floor / unit time for this encounter was 20 minutes.  Counseling and / or coordination of care was greater than fifty percent of the time.

## 2017-07-08 NOTE — Progress Notes (Signed)
S: No c/o. Good FM. No LOF/VB/ctxn.  O:  Vitals:   07/08/17 0542 07/08/17 0808  BP: (!) 97/52 (!) 105/50  Pulse: 87 93  Resp: 18 18  Temp: 98.4 F (36.9 C) 98.7 F (37.1 C)  SpO2: 100% 98%    Gen: well appearing, NAD ABd: ut gravid, nontender GYN: no bleeding or LOF on underwear  A/P:  31yo G2P1001 @ 30.4wga with oligo, admitted @ 29.2wga with anhydramnios. W/u was unremarkable and pt did not appear to have PPROM. Fetus also now dx w/IUGR.   # Oligo/IUGR: BPP daily - 6/8 yesterday NST q shift Dopplers weekly - last 12/20 WNL, next due 12/27 S/p BMZ 11/30-12/1, consider rescue course prior to delivery if <34 wga NICU c/s pending today  # MWB: No c/o  # ROD:  For rCS   Faith AgeeElise Johnn Krasowski MD

## 2017-07-09 ENCOUNTER — Inpatient Hospital Stay (HOSPITAL_COMMUNITY): Payer: 59

## 2017-07-09 NOTE — Progress Notes (Signed)
    S: No c/o. Good FM. No LOF/VB/ctxn.  O:  Vitals:   07/09/17 0526 07/09/17 0840  BP: (!) 93/43 (!) 100/52  Pulse: 88 88  Resp: 17 16  Temp: 97.9 F (36.6 C) 98.1 F (36.7 C)  SpO2: 98% 100%    Gen: well appearing, NAD ABd: ut gravid, nontender GYN: no bleeding or LOF on underwear  A/P:  31yo G2P1001 @ 30.5wga with oligo, admitted @ 29.2wga with anhydramnios. W/u was unremarkable and pt did not appear to have PPROM. Fetus also now dx w/IUGR.   # Oligo/IUGR: BPP daily - 6/8 yesterday NST q shift Dopplers weekly - last 12/20 WNL, next due 12/27 S/p BMZ 11/30-12/1, consider rescue course prior to delivery if <34 wga S/p NICU c/s  # MWB: No c/o  # ROD:  For rCS   Belva AgeeElise Lakasha Mcfall MD

## 2017-07-10 LAB — TYPE AND SCREEN
ABO/RH(D): A POS
Antibody Screen: NEGATIVE

## 2017-07-10 NOTE — Progress Notes (Addendum)
S: Feeling fine. Good FM. No LOF/VB/ctxn.  O: Vitals:   07/09/17 2348 07/10/17 0423  BP:  120/60  Pulse: 98 88  Resp: 18 18  Temp: 98.4 F (36.9 C) 98.4 F (36.9 C)  SpO2: 100% 98%    Gen: well appearing, NAD ABd: ut gravid, nontender GYN: no bleeding or LOF on underwear  A/P:31yo [id number]@G2P1001@ 30.6wgawith oligo, admitted @ 29.2wga with anhydramnios. W/u was unremarkable and pt did not appear to have PPROM. Fetus also now dx w/IUGR.   # Oligo/IUGR: MFM rec spacing out BPP to weekly despite 6/8 since 2 off for fluid. However, should correlate w/NST and if nr paln for BPP that day, yesterday 8/8 since fluid has sig improved - AFI now >8 on PRELIM read, final read pending.  NST q shift Dopplers weekly - last 12/21 WNL, next due 12/27 S/p BMZ 11/30-12/1, consider rescue course prior to delivery if <34 wga S/p NICU c/s  # MWB: No c/o  # ROD:  For rCS   Belva AgeeElise Arneisha Kincannon MD

## 2017-07-11 ENCOUNTER — Inpatient Hospital Stay (HOSPITAL_COMMUNITY): Payer: 59

## 2017-07-11 NOTE — Discharge Instructions (Signed)
Intrauterine Growth Restriction Intrauterine growth restriction (IUGR) is when your baby is not growing normally during your pregnancy. A baby with IUGR is smaller than it should be and may weigh less than normal at birth. IUGR can result if there is a problem with the organ that supplies your baby with oxygen and nutrition (placenta). Usually, there is no way to prevent this type of problem. What are the causes? The most common cause of IUGR is a problem with the placenta or umbilical cord that causes your developing baby to get less oxygen or nutrition than needed. Other causes include:  Poor maternal nutrition.  Chemicals found in substances such as cigarettes, alcohol, and some illegal drugs.  Some prescription medicines.  Congenital defects.  Genetic disorders.  An infection.  Carrying more than one baby, such as twins or triplets (multiple gestations).  What increases the risk? This condition is more likely to develop in women who:  Are over the age of 35 at the time of delivery (advanced maternal age).  Have medical conditions such as high blood pressure, diabetes, heart or kidney disease, anemia, or conditions that increase the risk for blood clotting.  Live at a very high altitude during pregnancy.  Have a personal history or family history of IUGR.  Take medicines during pregnancy that are linked with congenital disabilities.  Have a personal or family history of a genetic disorder.  Come into contact with infected cat feces (toxoplasmosis).  Come into contact with chickenpox (varicella) or German measles (rubella).  Have or are at risk of getting an infectious disease such as syphilis, HIV, or herpes.  Eat poorly during their pregnancy.  Weigh less than 100 pounds.  Have a family history of multiple gestations.  Have had infertility treatments.  Use tobacco, illegal drugs, or drink alcohol during pregnancy.  What are the signs or symptoms? IUGR does not  cause many symptoms. You might notice that your baby does not move or kick very often. Also, your belly may not be as big as expected for the stage of your pregnancy. How is this diagnosed? This condition is diagnosed with physical and prenatal exams. Your health care provider will measure the size of your baby inside your womb during a routine screening exam using sound waves (ultrasound). Your health care provider will compare the size of your baby to the size of other babies at the same stage of development (gestational age). Your health care provider will diagnose IUGR if your baby is smaller than 90 percent of all other babies at the same gestational age. You may also have tests to find the cause of IUGR. These can include:  Having fluid removed from your womb to check for signs of infection or a congenital disability (amniocentesis).  A series of tests to monitor your baby's health (fetal monitoring).  How is this treated? In most cases, treatment for this condition focuses on stopping the cause of your baby's small growth. Your health care providers will monitor your pregnancy closely and help you manage your pregnancy. If this condition is caused by a placenta problem and the baby is not getting enough blood, treatment may include:  Medicine to start labor and deliver your baby early (induction).  Cesarean delivery. In this procedure, your baby is delivered through a cut (incision) in the abdomen and womb (uterus).  Follow these instructions at home:  Make sure you are eating enough calories and gaining enough weight.  Eat a balanced diet. Work with a nutrition specialist (  dietitian), if needed.  Rest as needed. Try to get at least eight hours of sleep every night.  Do not drink alcohol.  Do not use illegal drugs.  Do not use any tobacco products, including cigarettes, chewing tobacco, or e-cigarettes. If you need help quitting, ask your health care provider.  Talk to your  health care provider about steps you can take to avoid infections.  Take medicines, vitamins, and mineral supplements only as directed by your health care provider. Make sure that your health care provider knows about all of the prescribed or over-the-counter medicines, supplements, vitamins, eye drops, and creams that you are using.  Keep all follow-up visits as directed by your health care provider. This is important. Contact a health care provider if:  Your baby is not moving as often as before. This information is not intended to replace advice given to you by your health care provider. Make sure you discuss any questions you have with your health care provider. Document Released: 04/13/2008 Document Revised: 12/11/2015 Document Reviewed: 07/01/2014 Elsevier Interactive Patient Education  2018 [employer]vier Inc.  

## 2017-07-11 NOTE — Progress Notes (Signed)
Patient ID: Leida LauthShontee McMillian, female   DOB: 09/09/1985, 31 y.o.   MRN: [id number] Pt without complaints GFM No ctxs No LOF  VSSAF FHR 140s Cat 1 Ctxs none  Abd Gravid, nt Neg homans bil  IUP at 30 0/7 Severe Oligohydraminos, poss IUGR with normal doppler studies.  Last BPP 6/8  Next due 12/27 Continue Continuous EFM S/P BMZ and normal NIPT, S/P NICU consult Prev C/S DL

## 2017-07-11 NOTE — Progress Notes (Signed)
Discharged home, ambulatory, in stable condition. 

## 2017-07-11 NOTE — Discharge Summary (Signed)
Physician Discharge Summary  Patient ID: Faith LauthShontee McMillian MRN: [id number] DOB/AGE: 33/11/1985 33 y.o.  Admit date: 06/30/2017 Discharge date: 07/11/2017  Admission Diagnoses:Anhydraminos, IUGR Discharge Diagnoses: Oligohydraminos, IUGR Active Problems:   Anhydramnios   Discharged Condition: good  Hospital Course: Pt admitted for IVF, and continuous EFM due to high risk for sttillbirth due to IUGR and no measureable AFI.  S/P BMZ, she had NICU consult while at [facility].  Had good response to rest and fluids and today, had normal AFI, recent dopplers normal and MFM rec outpatient management  Consults: MFM  Significant Diagnostic Studies: Ultrasounds  Treatments: IV hydration, EFM  Discharge Exam: Blood pressure 126/70, pulse (!) 107, temperature 98.5 F (36.9 C), temperature source Oral, resp. rate 16, height 5\' 7"  (1.702 m), weight 228 lb (103.4 kg), last menstrual period 12/06/2016, SpO2 100 %. General appearance: alert, cooperative, appears stated age and no distress Incision/Wound:  Disposition: 01-Home or Self Care  Discharge Instructions    Diet general   Complete by:  As directed      Allergies as of 07/11/2017   No Known Allergies     Medication List    TAKE these medications   acetaminophen 325 MG tablet Commonly known as:  TYLENOL Take 650 mg by mouth every 6 (six) hours as needed for mild pain.   PRENATAL VITAMIN PO Take by mouth.        Signed: Turner DanielsLOWE,Audrena Talaga C 07/11/2017, 2:23 PM

## 2017-07-11 NOTE — Progress Notes (Signed)
Discharge instructions given to patient and she verbalized understanding of all instructions provided. Instructed patient that maternal-fetal medicine dept. Will call her to schedule appt. For ultrasound on Thursday for BPP and Doppler studies. Instructed patient to call MD office on 07-13-17 to make appt. To be seen in office this week. Written copy of AVS given to patient.

## 2017-07-11 NOTE — Progress Notes (Addendum)
Spoke with Dr. Sherrie Georgeecker regarding pt and plan for BPP & Doppler studies on 12/27. Schedulers have left for the day, so Dr. Sherrie Georgeecker will put order on their desk and they will contact her on Wed. am when they return from the holiday to schedule this for sometime on Thursday. Will pass this along to patients RN. Sheryn BisonGordon, Leanard Dimaio Warner

## 2017-07-11 NOTE — Progress Notes (Signed)
Patient ID: Faith Hanson, female   DOB: 01/24/1986, 31 y.o.   MRN: [id number] US today with normal AFI of 10 BPP 8/8 Prev Dopplers normal MFM rec outpt management.  With f/u in 3 days for dopplers and BPP DL

## 2017-07-14 ENCOUNTER — Encounter (HOSPITAL_COMMUNITY): Payer: Self-pay

## 2017-07-14 ENCOUNTER — Ambulatory Visit (HOSPITAL_COMMUNITY)
Admission: RE | Admit: 2017-07-14 | Discharge: 2017-07-14 | Disposition: A | Discharge status: Home / Self Care | Payer: 59 | Source: Ambulatory Visit | Attending: Obstetrics and Gynecology | Admitting: Obstetrics and Gynecology

## 2017-07-14 ENCOUNTER — Other Ambulatory Visit (HOSPITAL_COMMUNITY): Payer: Self-pay | Admitting: Obstetrics and Gynecology

## 2017-07-14 ENCOUNTER — Other Ambulatory Visit (HOSPITAL_COMMUNITY): Payer: Self-pay | Admitting: *Deleted

## 2017-07-14 ENCOUNTER — Other Ambulatory Visit (HOSPITAL_COMMUNITY): Payer: Self-pay | Admitting: Maternal and Fetal Medicine

## 2017-07-14 DIAGNOSIS — O34219 Maternal care for unspecified type scar from previous cesarean delivery: Secondary | ICD-10-CM | POA: Insufficient documentation

## 2017-07-14 DIAGNOSIS — O365990 Maternal care for other known or suspected poor fetal growth, unspecified trimester, not applicable or unspecified: Secondary | ICD-10-CM

## 2017-07-14 DIAGNOSIS — Z98891 History of uterine scar from previous surgery: Secondary | ICD-10-CM

## 2017-07-14 DIAGNOSIS — O365930 Maternal care for other known or suspected poor fetal growth, third trimester, not applicable or unspecified: Secondary | ICD-10-CM | POA: Insufficient documentation

## 2017-07-14 DIAGNOSIS — Z3A31 31 weeks gestation of pregnancy: Secondary | ICD-10-CM

## 2017-07-14 DIAGNOSIS — O4190X0 Disorder of amniotic fluid and membranes, unspecified, unspecified trimester, not applicable or unspecified: Secondary | ICD-10-CM

## 2017-07-14 DIAGNOSIS — O4100X0 Oligohydramnios, unspecified trimester, not applicable or unspecified: Secondary | ICD-10-CM | POA: Diagnosis not present

## 2017-07-14 DIAGNOSIS — Z362 Encounter for other antenatal screening follow-up: Secondary | ICD-10-CM | POA: Insufficient documentation

## 2017-07-19 NOTE — L&D Delivery Note (Addendum)
VBAC/Delivery Note  SVD viable female Apgars 9,9 over intact perineum.  Placenta delivered spontaneously intact with 3VC. Good support and hemostasis noted.  R/V exam confirms.  PH art was sent.   Mother and baby to couplet care and are doing well.  EBL 200cc  Faith Campavid Jacelynn Hayton, MD

## 2017-07-21 ENCOUNTER — Ambulatory Visit (HOSPITAL_COMMUNITY)
Admission: RE | Admit: 2017-07-21 | Discharge: 2017-07-21 | Disposition: A | Discharge status: Home / Self Care | Payer: Managed Care, Other (non HMO) | Source: Ambulatory Visit | Attending: Obstetrics and Gynecology | Admitting: Obstetrics and Gynecology

## 2017-07-21 ENCOUNTER — Other Ambulatory Visit (HOSPITAL_COMMUNITY): Payer: Self-pay | Admitting: Maternal and Fetal Medicine

## 2017-07-21 ENCOUNTER — Encounter (HOSPITAL_COMMUNITY): Payer: Self-pay

## 2017-07-21 DIAGNOSIS — O365990 Maternal care for other known or suspected poor fetal growth, unspecified trimester, not applicable or unspecified: Secondary | ICD-10-CM

## 2017-07-21 DIAGNOSIS — O365930 Maternal care for other known or suspected poor fetal growth, third trimester, not applicable or unspecified: Secondary | ICD-10-CM | POA: Insufficient documentation

## 2017-07-21 DIAGNOSIS — Z3A32 32 weeks gestation of pregnancy: Secondary | ICD-10-CM | POA: Diagnosis present

## 2017-07-21 DIAGNOSIS — O09893 Supervision of other high risk pregnancies, third trimester: Secondary | ICD-10-CM | POA: Insufficient documentation

## 2017-07-21 DIAGNOSIS — O34219 Maternal care for unspecified type scar from previous cesarean delivery: Secondary | ICD-10-CM | POA: Insufficient documentation

## 2017-07-22 ENCOUNTER — Other Ambulatory Visit (HOSPITAL_COMMUNITY): Payer: Self-pay | Admitting: *Deleted

## 2017-07-22 DIAGNOSIS — O365930 Maternal care for other known or suspected poor fetal growth, third trimester, not applicable or unspecified: Secondary | ICD-10-CM

## 2017-07-25 ENCOUNTER — Encounter (HOSPITAL_COMMUNITY): Payer: Self-pay

## 2017-07-25 ENCOUNTER — Inpatient Hospital Stay (HOSPITAL_COMMUNITY)
Admission: AD | Admit: 2017-07-25 | Discharge: 2017-07-26 | Disposition: A | Discharge status: Home / Self Care | Payer: Managed Care, Other (non HMO) | Source: Ambulatory Visit | Attending: Obstetrics and Gynecology | Admitting: Obstetrics and Gynecology

## 2017-07-25 DIAGNOSIS — M549 Dorsalgia, unspecified: Secondary | ICD-10-CM | POA: Diagnosis present

## 2017-07-25 DIAGNOSIS — N898 Other specified noninflammatory disorders of vagina: Secondary | ICD-10-CM | POA: Diagnosis not present

## 2017-07-25 DIAGNOSIS — O26893 Other specified pregnancy related conditions, third trimester: Secondary | ICD-10-CM | POA: Diagnosis not present

## 2017-07-25 DIAGNOSIS — O9989 Other specified diseases and conditions complicating pregnancy, childbirth and the puerperium: Secondary | ICD-10-CM

## 2017-07-25 DIAGNOSIS — R109 Unspecified abdominal pain: Secondary | ICD-10-CM

## 2017-07-25 DIAGNOSIS — Z3A33 33 weeks gestation of pregnancy: Secondary | ICD-10-CM | POA: Diagnosis not present

## 2017-07-25 DIAGNOSIS — O26899 Other specified pregnancy related conditions, unspecified trimester: Secondary | ICD-10-CM

## 2017-07-25 LAB — URINALYSIS, ROUTINE W REFLEX MICROSCOPIC
BILIRUBIN URINE: NEGATIVE
GLUCOSE, UA: NEGATIVE mg/dL
HGB URINE DIPSTICK: NEGATIVE
Ketones, ur: NEGATIVE mg/dL
Leukocytes, UA: NEGATIVE
Nitrite: NEGATIVE
PH: 7 (ref 5.0–8.0)
Protein, ur: NEGATIVE mg/dL
SPECIFIC GRAVITY, URINE: 1.006 (ref 1.005–1.030)

## 2017-07-25 LAB — WET PREP, GENITAL
Clue Cells Wet Prep HPF POC: NONE SEEN
SPERM: NONE SEEN
Trich, Wet Prep: NONE SEEN
WBC, Wet Prep HPF POC: NONE SEEN
Yeast Wet Prep HPF POC: NONE SEEN

## 2017-07-25 LAB — FETAL FIBRONECTIN: FETAL FIBRONECTIN: NEGATIVE

## 2017-07-25 NOTE — MAU Provider Note (Signed)
History     CSN: [id number]  Arrival date and time: 07/25/17 2054   First Provider Initiated Contact with Patient 07/25/17 2223     Chief Complaint  Patient presents with  . Abdominal Cramping  . Back Pain   HPI Faith Hanson is a 32 y.o. G2P1001 at [redacted]w[redacted]d who presents with back pain, abdominal cramping and discharge. She states she has had constant back pain for 4 days that she rates a 5/10 and has not taken any medication for. She also reports feeling like her abdomen has been tight all day today. She states she saw some brown stringy discharge when she wiped today. She reports good fetal movement. Denies vaginal bleeding or leaking of fluid.  OB History    Gravida Para Term Preterm AB Living   2 1 1     1    SAB TAB Ectopic Multiple Live Births           1      Past Medical History:  Diagnosis Date  . Medical history non-contributory     Past Surgical History:  Procedure Laterality Date  . CESAREAN SECTION      Family History  Problem Relation Age of Onset  . Diabetes Maternal Grandmother     Social History   Tobacco Use  . Smoking status: Never Smoker  . Smokeless tobacco: Never Used  Substance Use Topics  . Alcohol use: Yes    Comment: rarely   . Drug use: No    Allergies: No Known Allergies  Medications Prior to Admission  Medication Sig Dispense Refill Last Dose  . Prenatal Vit-Fe Fumarate-FA (PRENATAL VITAMIN PO) Take by mouth.   07/25/2017 at Unknown time  . acetaminophen (TYLENOL) 325 MG tablet Take 650 mg by mouth every 6 (six) hours as needed for mild pain.   Taking    Review of Systems  Constitutional: Negative.  Negative for fatigue and fever.  HENT: Negative.   Respiratory: Negative.  Negative for shortness of breath.   Cardiovascular: Negative.  Negative for chest pain.  Gastrointestinal: Positive for abdominal pain. Negative for constipation, diarrhea, nausea and vomiting.  Genitourinary: Positive for vaginal discharge. Negative for  dysuria and vaginal bleeding.  Musculoskeletal: Positive for back pain.  Neurological: Negative.  Negative for dizziness and headaches.   Physical Exam   Blood pressure 121/64, pulse (!) 102, temperature 98.2 F (36.8 C), temperature source Oral, resp. rate 18, height 5\' 7"  (1.702 m), weight 234 lb 6.4 oz (106.3 kg), last menstrual period 12/06/2016, SpO2 100 %.  Physical Exam  Nursing note and vitals reviewed. Constitutional: She is oriented to person, place, and time. She appears well-developed and well-nourished. No distress.  HENT:  Head: Normocephalic.  Eyes: Pupils are equal, round, and reactive to light.  Cardiovascular: Normal rate, regular rhythm and normal heart sounds.  Respiratory: Effort normal and breath sounds normal. No respiratory distress.  GI: Soft. Bowel sounds are normal. She exhibits no distension. There is no tenderness.  Neurological: She is alert and oriented to person, place, and time.  Skin: Skin is warm and dry.  Psychiatric: She has a normal mood and affect. Her behavior is normal. Judgment and thought content normal.   Dilation: Closed Effacement (%): Thick Station: -3 Exam by:: Ma Hillock CNM  Fetal Tracing:  Baseline: 130 Variability: moderate Accels: 15x15 Decels: none  Toco: none  MAU Course  Procedures Results for orders placed or performed during the hospital encounter of 07/25/17 (from the past 24 hour(s))  Urinalysis, Routine w reflex microscopic     Status: None   Collection Time: 07/25/17  9:08 PM  Result Value Ref Range   Color, Urine YELLOW YELLOW   APPearance CLEAR CLEAR   Specific Gravity, Urine 1.006 1.005 - 1.030   pH 7.0 5.0 - 8.0   Glucose, UA NEGATIVE NEGATIVE mg/dL   Hgb urine dipstick NEGATIVE NEGATIVE   Bilirubin Urine NEGATIVE NEGATIVE   Ketones, ur NEGATIVE NEGATIVE mg/dL   Protein, ur NEGATIVE NEGATIVE mg/dL   Nitrite NEGATIVE NEGATIVE   Leukocytes, UA NEGATIVE NEGATIVE  Wet prep, genital     Status: None    Collection Time: 07/25/17 10:25 PM  Result Value Ref Range   Yeast Wet Prep HPF POC NONE SEEN NONE SEEN   Trich, Wet Prep NONE SEEN NONE SEEN   Clue Cells Wet Prep HPF POC NONE SEEN NONE SEEN   WBC, Wet Prep HPF POC NONE SEEN NONE SEEN   Sperm NONE SEEN   Fetal fibronectin     Status: None   Collection Time: 07/25/17 10:30 PM  Result Value Ref Range   Fetal Fibronectin NEGATIVE NEGATIVE   MDM UA Wet prep and gc/chlamydia Reviewed with Dr. Marcelle OverlieHolland- will send FFN, if negative can discharge home with follow up this week FFN- negative Assessment and Plan   1. Abdominal pain affecting pregnancy   2. Back pain affecting pregnancy in third trimester   3. Vaginal discharge during pregnancy in third trimester   4. [redacted] weeks gestation of pregnancy    -Discharge home in stable condition -Preterm labor precautions discussed -Patient advised to follow-up with [facility] this week in the office. -Patient may return to MAU as needed or if her condition were to change or worsen  Rolm BookbinderCaroline M Neill CNM 07/26/2017, 12:01 AM

## 2017-07-25 NOTE — MAU Note (Signed)
Pt has had back pain and cramping past few days. Today she saw small amount of "brown stringy tissue when she wiped. Called Dr and was told to come in.  Denies bleeding, LOF. +FM

## 2017-07-26 DIAGNOSIS — O26893 Other specified pregnancy related conditions, third trimester: Secondary | ICD-10-CM | POA: Diagnosis not present

## 2017-07-26 LAB — GC/CHLAMYDIA PROBE AMP (~~LOC~~) NOT AT ARMC
Chlamydia: NEGATIVE
Neisseria Gonorrhea: NEGATIVE

## 2017-07-26 NOTE — Discharge Instructions (Signed)

## 2017-07-28 ENCOUNTER — Ambulatory Visit (HOSPITAL_COMMUNITY)
Admission: RE | Admit: 2017-07-28 | Discharge: 2017-07-28 | Disposition: A | Discharge status: Home / Self Care | Payer: Managed Care, Other (non HMO) | Source: Ambulatory Visit | Attending: Obstetrics and Gynecology | Admitting: Obstetrics and Gynecology

## 2017-07-28 DIAGNOSIS — Z3A33 33 weeks gestation of pregnancy: Secondary | ICD-10-CM | POA: Diagnosis not present

## 2017-07-28 DIAGNOSIS — O34219 Maternal care for unspecified type scar from previous cesarean delivery: Secondary | ICD-10-CM | POA: Insufficient documentation

## 2017-07-28 DIAGNOSIS — O365930 Maternal care for other known or suspected poor fetal growth, third trimester, not applicable or unspecified: Secondary | ICD-10-CM | POA: Insufficient documentation

## 2017-08-04 ENCOUNTER — Encounter (HOSPITAL_COMMUNITY): Payer: Self-pay

## 2017-08-04 ENCOUNTER — Other Ambulatory Visit (HOSPITAL_COMMUNITY): Payer: Self-pay | Admitting: Maternal and Fetal Medicine

## 2017-08-04 ENCOUNTER — Ambulatory Visit (HOSPITAL_COMMUNITY)
Admission: RE | Admit: 2017-08-04 | Discharge: 2017-08-04 | Disposition: A | Discharge status: Home / Self Care | Payer: Managed Care, Other (non HMO) | Source: Ambulatory Visit | Attending: Obstetrics and Gynecology | Admitting: Obstetrics and Gynecology

## 2017-08-04 VITALS — BP 116/63 | HR 101

## 2017-08-04 DIAGNOSIS — O34219 Maternal care for unspecified type scar from previous cesarean delivery: Secondary | ICD-10-CM

## 2017-08-04 DIAGNOSIS — Z3A34 34 weeks gestation of pregnancy: Secondary | ICD-10-CM | POA: Diagnosis not present

## 2017-08-04 DIAGNOSIS — O365930 Maternal care for other known or suspected poor fetal growth, third trimester, not applicable or unspecified: Secondary | ICD-10-CM | POA: Insufficient documentation

## 2017-08-04 DIAGNOSIS — O4103X0 Oligohydramnios, third trimester, not applicable or unspecified: Secondary | ICD-10-CM

## 2017-08-04 IMAGING — US US MFM FETAL BPP W/O NON-STRESS
1 series · 15 of 28 positions shown · non-contrast
Comparison: none

[Series 1: us mfm fetal bpp w/o non-stress · 35 acquisitions, 15 frames shown]
[im 1/35]
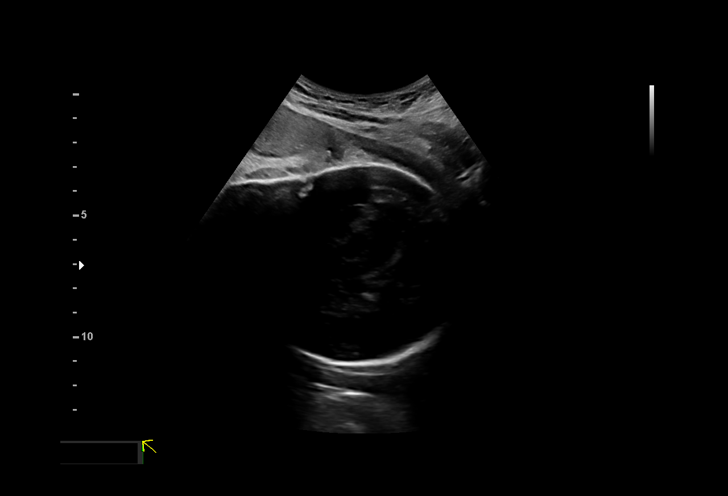
[im 3/35]
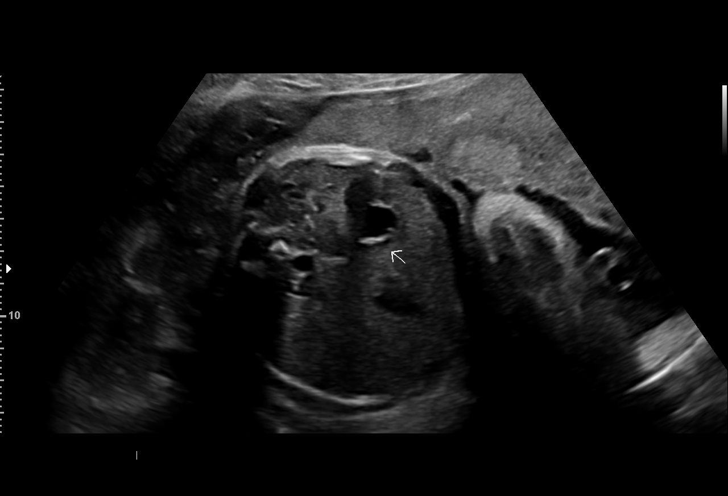
[im 6/35]
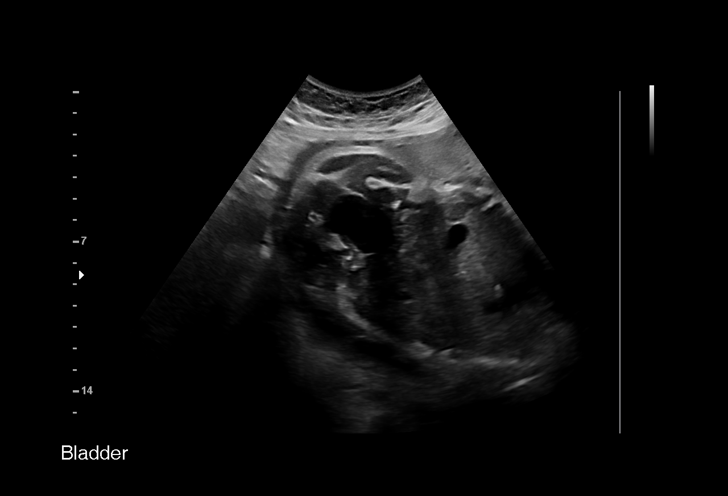
[im 8/35]
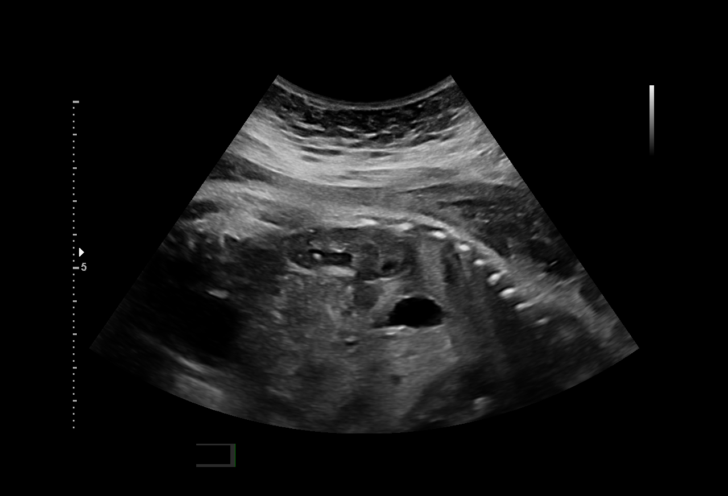
[im 11/35]
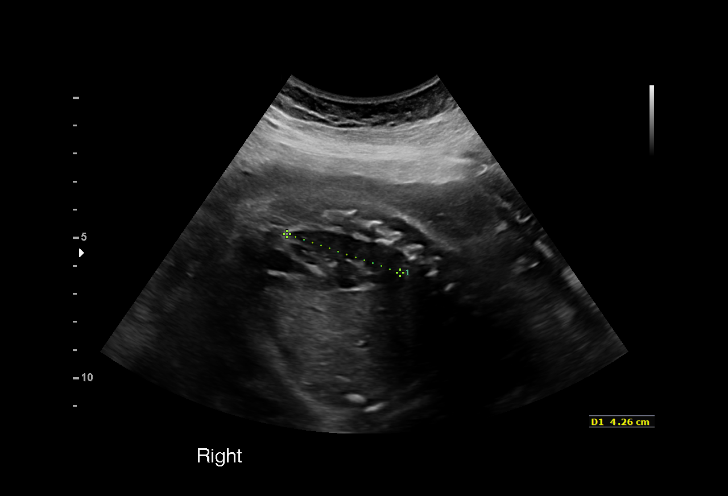
[im 13/35]
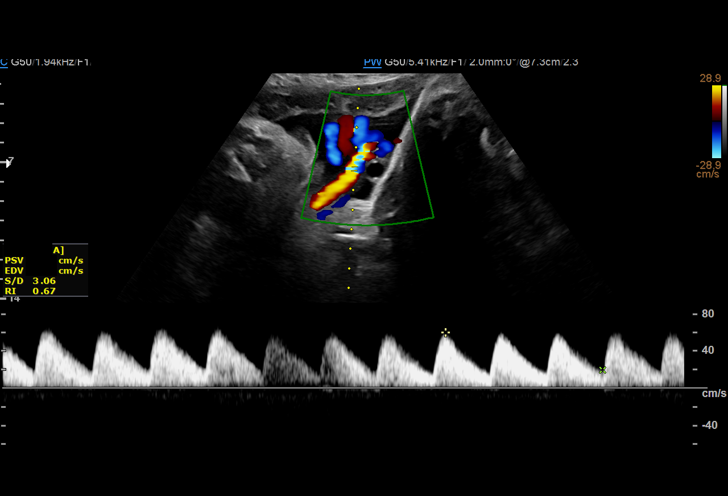
[im 16/35]
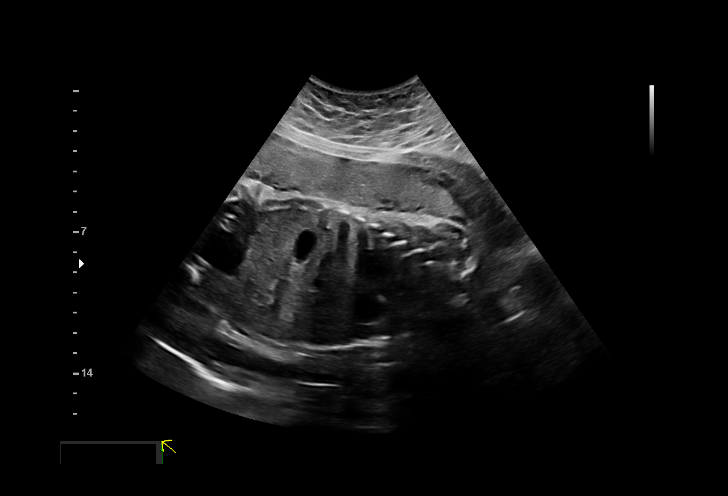
[im 18/35]
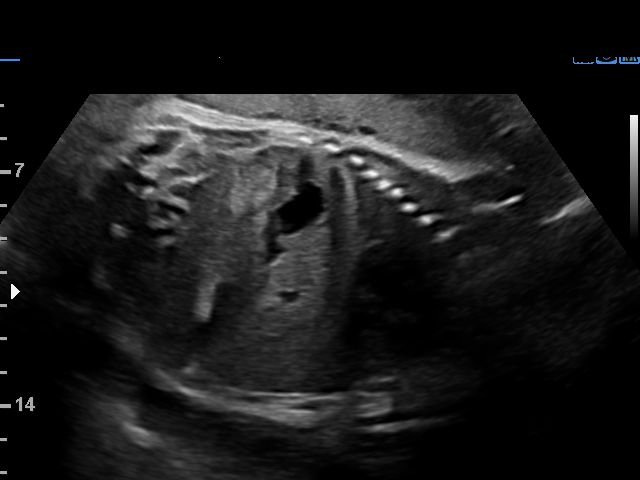
[im 19/35]
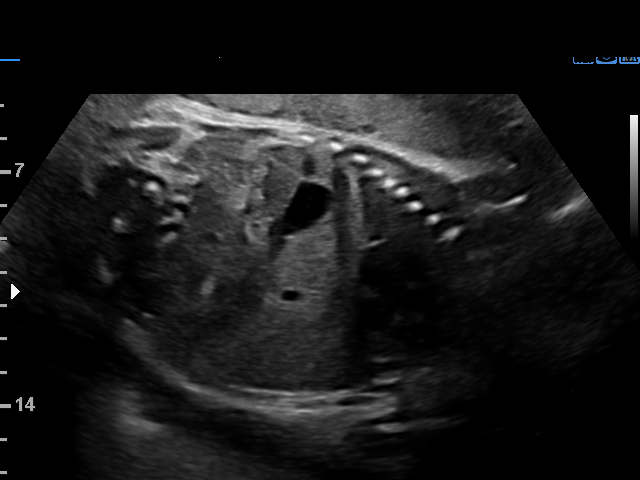
[im 22/35]
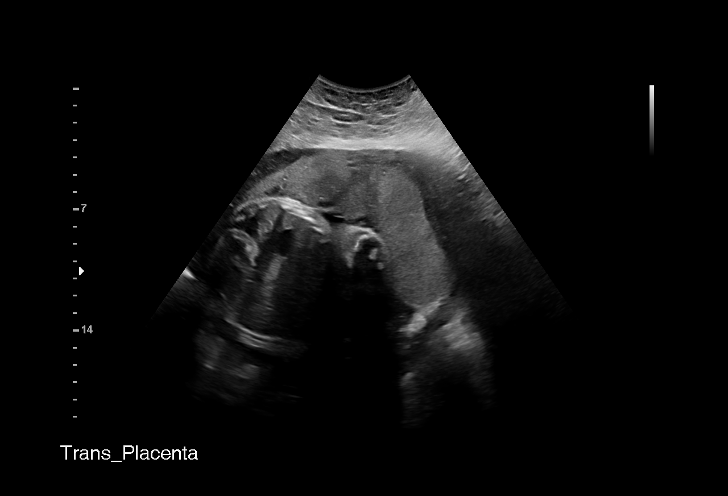
[im 24/35]
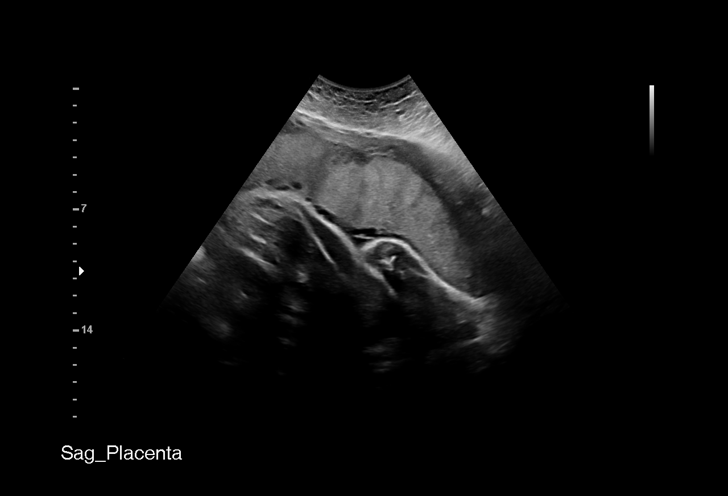
[im 27/35]
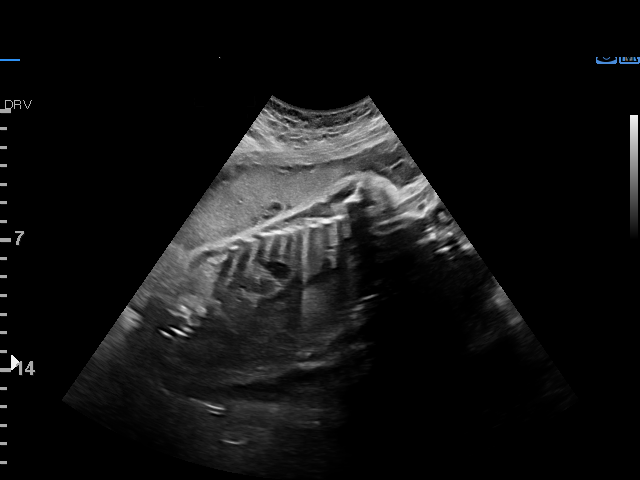
[im 29/35]
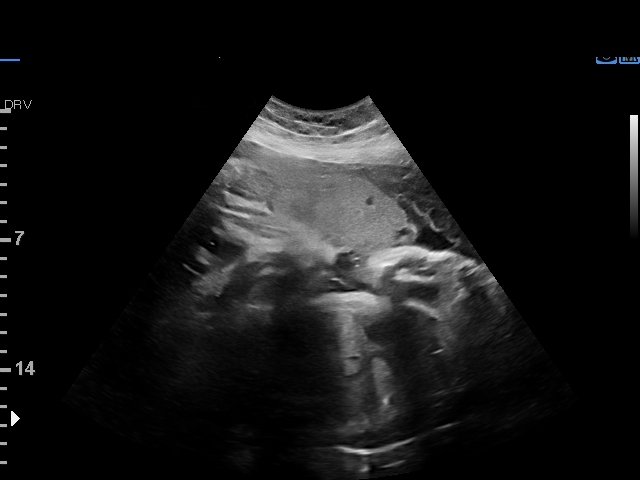
[im 32/35]
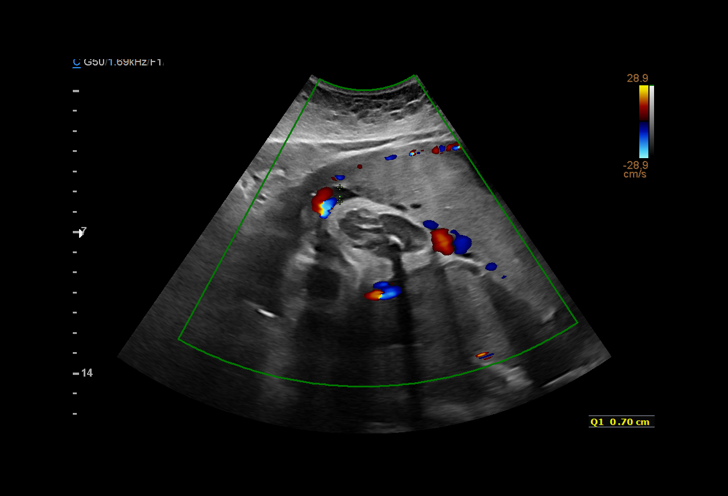
[im 35/35]
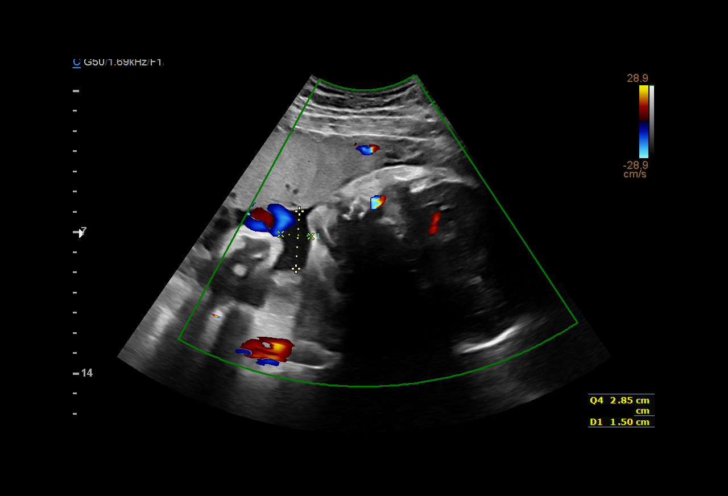

[15 of 28 positions shown; findings below may reference images not displayed]

Attending:        SRICHAI         Secondary Phy.:   3rd Nursing- HR
OB

1  SRICHAI            [PHONE_NUMBER]      [PHONE_NUMBER]     [PHONE_NUMBER]
2  SRICHAI            [PHONE_NUMBER]      [PHONE_NUMBER]     [PHONE_NUMBER]
Indications

34 weeks gestation of pregnancy
Maternal care for known or suspected poor      [X5]
fetal growth, third trimester, not applicable or
unspecified
Previous cesarean delivery, antepartum         [X5]
OB History

Blood Type:            Height:  5'7"   Weight (lb):  190       BMI:
Gravidity:    2         Term:   1        Prem:   0        SAB:   0
TOP:          0       Ectopic:  0        Living: 1
Fetal Evaluation

Num Of Fetuses:     1
Fetal Heart         153
Rate(bpm):
Cardiac Activity:   Observed
Presentation:       Cephalic
Placenta:           Anterior, above cervical os

Amniotic Fluid
AFI FV:      Subjectively low-normal

AFI Sum(cm)     %Tile       Largest Pocket(cm)
5.29            < 3
RUQ(cm)       RLQ(cm)       LUQ(cm)        LLQ(cm)
0.7           2.85          1.74           0
Biophysical Evaluation

Amniotic F.V:   Pocket < 2 cm two          F. Tone:        Observed
planes
F. Movement:    Observed                   Score:          [DATE]
F. Breathing:   Observed
Gestational Age

LMP:           34w 3d        Date:  [DATE]                 EDD:   [DATE]
Best:          34w 3d     Det. By:  LMP  ([DATE])          EDD:   [DATE]
Anatomy

Diaphragm:             Appears normal         Kidneys:                Appear normal
Stomach:               Appears normal, left   Bladder:                Appears normal
sided
Doppler - Fetal Vessels

Umbilical Artery
S/D     %tile                                     PSV    ADFV    RDFV
(cm/s)
2.8       64                                     56.45      No      No

Cervix Uterus Adnexa

Cervix
Not visualized (advanced GA >[X5])
Impression

Singleton intrauterine pregnancy at 34 weeks 3 days
gestation with fetal cardiac activity
Cephalic presentation
Anterior placenta
BPP [DATE] (-2 for no 2x2 pocket of fluid)
Recommendations

Continue weekly BPP and UA dopplers.
Growth ultrasound serially

## 2017-08-04 NOTE — Procedures (Signed)
Faith ParentsShontee Hanson 01/04/1986 6437w3d  Fetus A Non-Stress Test Interpretation for 08/04/17  Indication: Unsatisfactory BPP  Fetal Heart Rate A Mode: External Baseline Rate (A): 140 bpm Variability: Moderate Accelerations: 15 x 15 Decelerations: None Multiple birth?: No  Uterine Activity Mode: Toco Contraction Frequency (min): none noted  Interpretation (Fetal Testing) Nonstress Test Interpretation: Reactive Comments: FHR tracing rev'd by Dr. Clarisa FlingBrost

## 2017-08-08 ENCOUNTER — Encounter (HOSPITAL_COMMUNITY): Payer: Self-pay | Admitting: *Deleted

## 2017-08-08 ENCOUNTER — Telehealth (HOSPITAL_COMMUNITY): Payer: Self-pay | Admitting: *Deleted

## 2017-08-08 NOTE — Telephone Encounter (Signed)
Preadmission screen  

## 2017-08-09 ENCOUNTER — Encounter (HOSPITAL_COMMUNITY): Payer: Self-pay | Admitting: *Deleted

## 2017-08-11 ENCOUNTER — Other Ambulatory Visit (HOSPITAL_COMMUNITY): Payer: Self-pay | Admitting: Maternal and Fetal Medicine

## 2017-08-11 ENCOUNTER — Encounter (HOSPITAL_COMMUNITY): Payer: Self-pay

## 2017-08-11 ENCOUNTER — Ambulatory Visit (HOSPITAL_COMMUNITY)
Admission: RE | Admit: 2017-08-11 | Discharge: 2017-08-11 | Disposition: A | Discharge status: Home / Self Care | Payer: Managed Care, Other (non HMO) | Source: Ambulatory Visit | Attending: Obstetrics and Gynecology | Admitting: Obstetrics and Gynecology

## 2017-08-11 DIAGNOSIS — O34219 Maternal care for unspecified type scar from previous cesarean delivery: Secondary | ICD-10-CM | POA: Diagnosis present

## 2017-08-11 DIAGNOSIS — Z362 Encounter for other antenatal screening follow-up: Secondary | ICD-10-CM | POA: Diagnosis present

## 2017-08-11 DIAGNOSIS — Z3A35 35 weeks gestation of pregnancy: Secondary | ICD-10-CM

## 2017-08-11 DIAGNOSIS — O365930 Maternal care for other known or suspected poor fetal growth, third trimester, not applicable or unspecified: Secondary | ICD-10-CM

## 2017-08-13 LAB — OB RESULTS CONSOLE GBS: GBS: POSITIVE

## 2017-08-22 ENCOUNTER — Inpatient Hospital Stay (HOSPITAL_COMMUNITY): Payer: Managed Care, Other (non HMO) | Admitting: Anesthesiology

## 2017-08-22 ENCOUNTER — Inpatient Hospital Stay (HOSPITAL_COMMUNITY)
Admission: RE | Admit: 2017-08-22 | Discharge: 2017-08-24 | DRG: 806 | Disposition: A | Discharge status: Home / Self Care | Payer: Managed Care, Other (non HMO) | Source: Ambulatory Visit | Attending: Obstetrics and Gynecology | Admitting: Obstetrics and Gynecology

## 2017-08-22 ENCOUNTER — Other Ambulatory Visit: Payer: Self-pay

## 2017-08-22 ENCOUNTER — Encounter (HOSPITAL_COMMUNITY): Payer: Self-pay

## 2017-08-22 DIAGNOSIS — O365930 Maternal care for other known or suspected poor fetal growth, third trimester, not applicable or unspecified: Principal | ICD-10-CM | POA: Diagnosis present

## 2017-08-22 DIAGNOSIS — Z3490 Encounter for supervision of normal pregnancy, unspecified, unspecified trimester: Secondary | ICD-10-CM | POA: Diagnosis present

## 2017-08-22 DIAGNOSIS — O99214 Obesity complicating childbirth: Secondary | ICD-10-CM | POA: Diagnosis present

## 2017-08-22 DIAGNOSIS — Z0000 Encounter for general adult medical examination without abnormal findings: Secondary | ICD-10-CM | POA: Diagnosis present

## 2017-08-22 DIAGNOSIS — O4103X0 Oligohydramnios, third trimester, not applicable or unspecified: Secondary | ICD-10-CM | POA: Diagnosis present

## 2017-08-22 DIAGNOSIS — O34211 Maternal care for low transverse scar from previous cesarean delivery: Secondary | ICD-10-CM | POA: Diagnosis present

## 2017-08-22 DIAGNOSIS — E669 Obesity, unspecified: Secondary | ICD-10-CM | POA: Diagnosis present

## 2017-08-22 DIAGNOSIS — Z3A37 37 weeks gestation of pregnancy: Secondary | ICD-10-CM | POA: Diagnosis not present

## 2017-08-22 DIAGNOSIS — O99824 Streptococcus B carrier state complicating childbirth: Secondary | ICD-10-CM | POA: Diagnosis present

## 2017-08-22 LAB — CBC
HEMATOCRIT: 34.4 % — AB (ref 36.0–46.0)
HEMOGLOBIN: 11.8 g/dL — AB (ref 12.0–15.0)
MCH: 30.5 pg (ref 26.0–34.0)
MCHC: 34.3 g/dL (ref 30.0–36.0)
MCV: 88.9 fL (ref 78.0–100.0)
Platelets: 283 10*3/uL (ref 150–400)
RBC: 3.87 MIL/uL (ref 3.87–5.11)
RDW: 15 % (ref 11.5–15.5)
WBC: 8.2 10*3/uL (ref 4.0–10.5)

## 2017-08-22 LAB — TYPE AND SCREEN
ABO/RH(D): A POS
ANTIBODY SCREEN: NEGATIVE

## 2017-08-22 LAB — RPR: RPR Ser Ql: NONREACTIVE

## 2017-08-22 MED ORDER — OXYCODONE-ACETAMINOPHEN 5-325 MG PO TABS: 2.0000 | ORAL_TABLET | ORAL | Status: DC | PRN

## 2017-08-22 MED ORDER — LIDOCAINE HCL (PF) 1 % IJ SOLN
30.0000 mL | INTRAMUSCULAR | Status: DC | PRN
  Filled 2017-08-22: qty 30

## 2017-08-22 MED ORDER — OXYTOCIN 40 UNITS IN LACTATED RINGERS INFUSION - SIMPLE MED: 2.5000 [IU]/h | INTRAVENOUS | Status: DC

## 2017-08-22 MED ORDER — SENNOSIDES-DOCUSATE SODIUM 8.6-50 MG PO TABS
2.0000 | ORAL_TABLET | ORAL | Status: DC
  Administered 2017-08-22 – 2017-08-24 (×2): 2 via ORAL
  Filled 2017-08-22 (×2): qty 2

## 2017-08-22 MED ORDER — PRENATAL MULTIVITAMIN CH
1.0000 | ORAL_TABLET | Freq: Every day | ORAL | Status: DC
  Administered 2017-08-23 – 2017-08-24 (×2): 1 via ORAL
  Filled 2017-08-22 (×2): qty 1

## 2017-08-22 MED ORDER — MEDROXYPROGESTERONE ACETATE 150 MG/ML IM SUSP: 150.0000 mg | INTRAMUSCULAR | Status: DC | PRN

## 2017-08-22 MED ORDER — LACTATED RINGERS IV SOLN
500.0000 mL | INTRAVENOUS | Status: DC | PRN
  Administered 2017-08-22: 500 mL via INTRAVENOUS

## 2017-08-22 MED ORDER — PENICILLIN G POT IN DEXTROSE 60000 UNIT/ML IV SOLN
3.0000 10*6.[IU] | INTRAVENOUS | Status: DC
  Administered 2017-08-22: 3 10*6.[IU] via INTRAVENOUS
  Filled 2017-08-22 (×4): qty 50

## 2017-08-22 MED ORDER — SOD CITRATE-CITRIC ACID 500-334 MG/5ML PO SOLN
30.0000 mL | ORAL | Status: DC | PRN
  Administered 2017-08-22: 30 mL via ORAL
  Filled 2017-08-22: qty 15

## 2017-08-22 MED ORDER — SIMETHICONE 80 MG PO CHEW: 80.0000 mg | CHEWABLE_TABLET | ORAL | Status: DC | PRN

## 2017-08-22 MED ORDER — TERBUTALINE SULFATE 1 MG/ML IJ SOLN
0.2500 mg | Freq: Once | INTRAMUSCULAR | Status: DC | PRN
  Filled 2017-08-22: qty 1

## 2017-08-22 MED ORDER — LIDOCAINE HCL (PF) 1 % IJ SOLN
INTRAMUSCULAR | Status: DC | PRN
  Administered 2017-08-22: 4 mL via EPIDURAL
  Administered 2017-08-22: 8 mL via EPIDURAL

## 2017-08-22 MED ORDER — EPHEDRINE 5 MG/ML INJ
10.0000 mg | INTRAVENOUS | Status: DC | PRN
  Administered 2017-08-22: 5 mg via INTRAVENOUS
  Filled 2017-08-22: qty 2

## 2017-08-22 MED ORDER — WITCH HAZEL-GLYCERIN EX PADS: 1.0000 "application " | MEDICATED_PAD | CUTANEOUS | Status: DC | PRN

## 2017-08-22 MED ORDER — LACTATED RINGERS IV SOLN
500.0000 mL | Freq: Once | INTRAVENOUS | Status: AC
  Administered 2017-08-22: 500 mL via INTRAVENOUS

## 2017-08-22 MED ORDER — OXYCODONE-ACETAMINOPHEN 5-325 MG PO TABS: 1.0000 | ORAL_TABLET | ORAL | Status: DC | PRN

## 2017-08-22 MED ORDER — PHENYLEPHRINE 40 MCG/ML (10ML) SYRINGE FOR IV PUSH (FOR BLOOD PRESSURE SUPPORT)
80.0000 ug | PREFILLED_SYRINGE | INTRAVENOUS | Status: AC | PRN
  Administered 2017-08-22 (×3): 80 ug via INTRAVENOUS
  Filled 2017-08-22: qty 10

## 2017-08-22 MED ORDER — MEASLES, MUMPS & RUBELLA VAC ~~LOC~~ INJ: 0.5000 mL | INJECTION | Freq: Once | SUBCUTANEOUS | Status: DC

## 2017-08-22 MED ORDER — ONDANSETRON HCL 4 MG PO TABS: 4.0000 mg | ORAL_TABLET | ORAL | Status: DC | PRN

## 2017-08-22 MED ORDER — TETANUS-DIPHTH-ACELL PERTUSSIS 5-2.5-18.5 LF-MCG/0.5 IM SUSP: 0.5000 mL | Freq: Once | INTRAMUSCULAR | Status: DC

## 2017-08-22 MED ORDER — ZOLPIDEM TARTRATE 5 MG PO TABS: 5.0000 mg | ORAL_TABLET | Freq: Every evening | ORAL | Status: DC | PRN

## 2017-08-22 MED ORDER — DIPHENHYDRAMINE HCL 50 MG/ML IJ SOLN: 12.5000 mg | INTRAMUSCULAR | Status: DC | PRN

## 2017-08-22 MED ORDER — ONDANSETRON HCL 4 MG/2ML IJ SOLN: 4.0000 mg | Freq: Four times a day (QID) | INTRAMUSCULAR | Status: DC | PRN

## 2017-08-22 MED ORDER — BENZOCAINE-MENTHOL 20-0.5 % EX AERO
1.0000 "application " | INHALATION_SPRAY | CUTANEOUS | Status: DC | PRN
  Administered 2017-08-22: 1 via TOPICAL
  Filled 2017-08-22: qty 56

## 2017-08-22 MED ORDER — IBUPROFEN 600 MG PO TABS
600.0000 mg | ORAL_TABLET | Freq: Four times a day (QID) | ORAL | Status: DC
  Administered 2017-08-22 – 2017-08-24 (×8): 600 mg via ORAL
  Filled 2017-08-22 (×8): qty 1

## 2017-08-22 MED ORDER — PENICILLIN G POTASSIUM 5000000 UNITS IJ SOLR
5.0000 10*6.[IU] | Freq: Once | INTRAVENOUS | Status: AC
  Administered 2017-08-22: 5 10*6.[IU] via INTRAVENOUS
  Filled 2017-08-22: qty 5

## 2017-08-22 MED ORDER — BUTORPHANOL TARTRATE 1 MG/ML IJ SOLN: 1.0000 mg | INTRAMUSCULAR | Status: DC | PRN

## 2017-08-22 MED ORDER — LACTATED RINGERS IV SOLN
INTRAVENOUS | Status: DC
  Administered 2017-08-22 (×3): via INTRAVENOUS

## 2017-08-22 MED ORDER — FENTANYL 2.5 MCG/ML BUPIVACAINE 1/10 % EPIDURAL INFUSION (WH - ANES)
14.0000 mL/h | INTRAMUSCULAR | Status: DC | PRN
  Administered 2017-08-22: 14 mL/h via EPIDURAL
  Filled 2017-08-22: qty 100

## 2017-08-22 MED ORDER — DIBUCAINE 1 % RE OINT: 1.0000 "application " | TOPICAL_OINTMENT | RECTAL | Status: DC | PRN

## 2017-08-22 MED ORDER — COCONUT OIL OIL
1.0000 "application " | TOPICAL_OIL | Status: DC | PRN
  Filled 2017-08-22: qty 120

## 2017-08-22 MED ORDER — DIPHENHYDRAMINE HCL 25 MG PO CAPS: 25.0000 mg | ORAL_CAPSULE | Freq: Four times a day (QID) | ORAL | Status: DC | PRN

## 2017-08-22 MED ORDER — OXYTOCIN BOLUS FROM INFUSION: 500.0000 mL | Freq: Once | INTRAVENOUS | Status: DC

## 2017-08-22 MED ORDER — ZOLPIDEM TARTRATE 5 MG PO TABS
5.0000 mg | ORAL_TABLET | Freq: Once | ORAL | Status: AC
  Administered 2017-08-22: 5 mg via ORAL
  Filled 2017-08-22: qty 1

## 2017-08-22 MED ORDER — ACETAMINOPHEN 325 MG PO TABS
650.0000 mg | ORAL_TABLET | ORAL | Status: DC | PRN
  Administered 2017-08-23: 650 mg via ORAL
  Filled 2017-08-22: qty 2

## 2017-08-22 MED ORDER — ONDANSETRON HCL 4 MG/2ML IJ SOLN: 4.0000 mg | INTRAMUSCULAR | Status: DC | PRN

## 2017-08-22 MED ORDER — EPHEDRINE 5 MG/ML INJ
10.0000 mg | INTRAVENOUS | Status: DC | PRN
  Filled 2017-08-22: qty 4
  Filled 2017-08-22: qty 2

## 2017-08-22 MED ORDER — OXYTOCIN 40 UNITS IN LACTATED RINGERS INFUSION - SIMPLE MED
1.0000 m[IU]/min | INTRAVENOUS | Status: DC
  Administered 2017-08-22: 4 m[IU]/min via INTRAVENOUS
  Administered 2017-08-22: 2 m[IU]/min via INTRAVENOUS
  Filled 2017-08-22: qty 1000

## 2017-08-22 MED ORDER — FLEET ENEMA 7-19 GM/118ML RE ENEM: 1.0000 | ENEMA | RECTAL | Status: DC | PRN

## 2017-08-22 MED ORDER — ACETAMINOPHEN 325 MG PO TABS: 650.0000 mg | ORAL_TABLET | ORAL | Status: DC | PRN

## 2017-08-22 MED ORDER — PHENYLEPHRINE 40 MCG/ML (10ML) SYRINGE FOR IV PUSH (FOR BLOOD PRESSURE SUPPORT)
80.0000 ug | PREFILLED_SYRINGE | INTRAVENOUS | Status: DC | PRN
  Filled 2017-08-22: qty 5

## 2017-08-22 NOTE — Anesthesia Procedure Notes (Signed)
Epidural Patient location during procedure: OB Start time: 08/22/2017 10:25 AM End time: 08/22/2017 10:29 AM  Staffing Anesthesiologist: Beryle LatheBrock, Thomas E, MD Performed: anesthesiologist   Preanesthetic Checklist Completed: patient identified, pre-op evaluation, timeout performed, IV checked, risks and benefits discussed and monitors and equipment checked  Epidural Patient position: sitting Prep: DuraPrep Patient monitoring: continuous pulse ox and blood pressure Approach: midline Location: L2-L3 Injection technique: LOR saline  Needle:  Needle type: Tuohy  Needle gauge: 17 G Needle length: 9 cm Needle insertion depth: 7 cm Catheter size: 19 Gauge Catheter at skin depth: 12 cm Test dose: negative and Other (1% lidocaine)  Additional Notes Patient identified. Risks including, but not limited to, bleeding, infection, nerve damage, paralysis, inadequate analgesia, blood pressure changes, nausea, vomiting, allergic reaction, postpartum back pain, itching, and headache were discussed. Patient expressed understanding and wished to proceed. Sterile prep and drape, including hand hygiene, mask, and sterile gloves were used. The patient was positioned and the spine was prepped. The skin was anesthetized with lidocaine. No paraesthesia or other complication noted. The patient did not experience any signs of intravascular injection such as tinnitus or metallic taste in mouth, nor signs of intrathecal spread such as rapid motor block. Please see nursing notes for vital signs. The patient tolerated the procedure well.   Leslye Peerhomas Brock, MDReason for block:procedure for pain

## 2017-08-22 NOTE — H&P (Signed)
Faith Hanson is a 32 y.o. female presenting for IOL due to IUGR, Oligo followed by MFM since beginning of last trimester.  Previously admitted for severe Oligo, has been followed with dopplers, EFW and BPP.  MFM rec delivery at 37 weeks.  GBS +.  Prev C/S desires TOL. OB History    Gravida Para Term Preterm AB Living   2 1 1     1    SAB TAB Ectopic Multiple Live Births           1     Past Medical History:  Diagnosis Date  . Medical history non-contributory   . Vaginal Pap smear, abnormal    Past Surgical History:  Procedure Laterality Date  . CESAREAN SECTION    . LEEP     Family History: family history includes Anemia in her sister; Colon cancer in her maternal aunt; Diabetes in her maternal grandmother; Hyperlipidemia in her mother; Thyroid disease in her sister. Social History:  reports that  has never smoked. she has never used smokeless tobacco. She reports that she drinks alcohol. She reports that she does not use drugs.     Maternal Diabetes: No Genetic Screening: Normal Maternal Ultrasounds/Referrals: Abnormal:  Findings:   IUGR, Other:Oligo Fetal Ultrasounds or other Referrals:  Referred to Materal Fetal Medicine  Maternal Substance Abuse:  No Significant Maternal Medications:  None Significant Maternal Lab Results:  None Other Comments:  None  ROS History Dilation: 1 Effacement (%): 80 Station: -2 Exam by:: Faith Hanson Blood pressure 111/66, pulse 97, temperature 97.8 F (36.6 C), temperature source Oral, resp. rate 16, height 5\' 7"  (1.702 m), weight 237 lb 12.8 oz (107.9 kg), last menstrual period 12/06/2016, SpO2 100 %. Exam Physical Exam  Prenatal labs: ABO, Rh: --/--/A POS (02/04 0127) Antibody: NEG (02/04 0127) Rubella: Immune (07/26 0000) RPR: Nonreactive (07/26 0000)  HBsAg: Negative (07/26 0000)  HIV: Non-reactive (07/26 0000)  GBS: Positive (01/26 0000)   Assessment/Plan: IUP at term IUGR/Oligo for IOL Prev C/S (LSTCS) for breech. She desires  TOL..  R&B discussed and informed consent obtainted Abx for GBS   Faith Hanson C 08/22/2017, 9:15 AM

## 2017-08-22 NOTE — Anesthesia Pain Management Evaluation Note (Signed)
  CRNA Pain Management Visit Note  Patient: Faith Hanson, 32 y.o., female  "Hello I am a [facility] the anesthesia [facility]. We have an anesthesia team available at all times to provide care throughout the hospital, including epidural management and anesthesia for C-section. I don't know your plan for the delivery whether it a natural birth, water birth, IV sedation, nitrous supplementation, doula or epidural, but we want to meet your pain goals."   1.Was your pain managed to your expectations on prior hospitalizations?   Yes   2.What is your expectation for pain management during this hospitalization?     Epidural  3.How can we help you reach that goal? Epidural being placed now.  Record the patient's initial score and the patient's pain goal.   Pain: 7  Pain Goal: 3 [facility] wants you to be able to say your pain was always managed very well.  Madelin Weseman 08/22/2017

## 2017-08-22 NOTE — Anesthesia Preprocedure Evaluation (Signed)
Anesthesia Evaluation  Patient identified by MRN, date of birth, ID band Patient awake    Reviewed: Allergy & Precautions, NPO status , Patient's Chart, lab work & pertinent test results  Airway Mallampati: II  TM Distance: >3 FB Neck ROM: Full    Dental  (+) Dental Advisory Given   Pulmonary neg pulmonary ROS,    Pulmonary exam normal breath sounds clear to auscultation       Cardiovascular negative cardio ROS Normal cardiovascular exam Rhythm:Regular Rate:Normal     Neuro/Psych negative neurological ROS  negative psych ROS   GI/Hepatic negative GI ROS, Neg liver ROS,   Endo/Other  Obesity  Renal/GU negative Renal ROS  negative genitourinary   Musculoskeletal negative musculoskeletal ROS (+)   Abdominal   Peds  Hematology negative hematology ROS (+)   Anesthesia Other Findings   Reproductive/Obstetrics (+) Pregnancy Previous c/s for breech                             Anesthesia Physical Anesthesia Plan  ASA: II  Anesthesia Plan: Epidural   Post-op Pain Management:    Induction:   PONV Risk Score and Plan:   Airway Management Planned: Natural Airway  Additional Equipment:   Intra-op Plan:   Post-operative Plan:   Informed Consent: I have reviewed the patients History and Physical, chart, labs and discussed the procedure including the risks, benefits and alternatives for the proposed anesthesia with the patient or authorized representative who has indicated his/her understanding and acceptance.     Plan Discussed with:   Anesthesia Plan Comments: (Labs reviewed. Platelets acceptable, patient not taking any blood thinning medications. Risks and benefits discussed with patient, patient expressed understanding and wished to proceed.)        Anesthesia Quick Evaluation

## 2017-08-23 ENCOUNTER — Other Ambulatory Visit: Payer: Self-pay

## 2017-08-23 LAB — CBC
HEMATOCRIT: 33.2 % — AB (ref 36.0–46.0)
Hemoglobin: 11.2 g/dL — ABNORMAL LOW (ref 12.0–15.0)
MCH: 29.9 pg (ref 26.0–34.0)
MCHC: 33.7 g/dL (ref 30.0–36.0)
MCV: 88.5 fL (ref 78.0–100.0)
PLATELETS: 242 10*3/uL (ref 150–400)
RBC: 3.75 MIL/uL — AB (ref 3.87–5.11)
RDW: 15 % (ref 11.5–15.5)
WBC: 10.7 10*3/uL — ABNORMAL HIGH (ref 4.0–10.5)

## 2017-08-23 NOTE — Anesthesia Postprocedure Evaluation (Signed)
Anesthesia Post Note  Patient: Faith Hanson  Procedure(s) Performed: AN AD HOC LABOR EPIDURAL     Patient location during evaluation: Mother Baby Anesthesia Type: Epidural Level of consciousness: awake Pain management: pain level controlled Vital Signs Assessment: post-procedure vital signs reviewed and stable Respiratory status: spontaneous breathing Cardiovascular status: stable Postop Assessment: epidural receding and patient able to bend at knees Anesthetic complications: no    Last Vitals:  Vitals:   08/22/17 2100 08/23/17 0500  BP: (!) 125/56 110/60  Pulse: 94 89  Resp: 18 18  Temp: 37.1 C 36.7 C  SpO2:      Last Pain:  Vitals:   08/23/17 0500  TempSrc: Oral  PainSc: 0-No pain   Pain Goal: Patients Stated Pain Goal: 1 (08/22/17 2325)               Edison PaceWILKERSON,Ridhi Hoffert

## 2017-08-23 NOTE — Lactation Note (Signed)
This note was copied from a baby's chart. Lactation Consultation Note  Patient Name: Faith Hanson ZOXWR'U Date: 08/23/2017 Reason for consult: Initial assessment;Difficult latch;Infant < 6lbs;Early term 37-38.6wks   Initial assessment with Exp BF mom of 23 hour old infant. Infant with 2 BF  For 10-15 minutes, 4 BF attempts, EBM x 2 of 2-5 cc, 4 voids and 1 stool in last 24 hours. Infant weight 5 lb 1.1 oz with 1% weight loss since birth. LATCH scores 7-9.   Mom reports infant was circumcised this morning and has been sleepy. Mom has been able to latch infant with the NS a few times.   Caring for your Late Preterm infant protocol started due size and GA. Discussed with mom the importance of limiting stimulation between feeds, wearing hat all the time, and feeding infant STS 8-12 x in 24 hours at first feeding cues with no longer than 3 hours between feeds. Discussed importance of regular calories in the infant. Reviewed with mom supplementation amounts as infant has not been feeding well. Enc mom to use EBM and follow with formula as needed. Reviewed increasing supplementation amounts based on day of age. Reviewed feeding behaviors of the LPT/Early Term infant.   Mom with soft compressible breasts and areola and short shaft everted nipples. Mom is using a hand pump to help evert nipples prior to latch.   Awakened infant to feed as he has not eaten since this morning. Infant awakened with stimulation and was put to breast STS. Infant would not latch. Hand expressed and fed infant 5 cc colostrum via spoon. Infant tolerated well. Infant did then latch to right breast with the use of # 20 NS. Mom able to apply NS and latch infant independently. Infant fed for less than 5 minutes and then fell asleep.   Worked with mom on hand expression and she is able to hand express colostrum. Mom was shown spoon feeding and she returned demo. Showed mom how to use curved tip syringe/finger to supplement infant,  we did not syringe feed at this feeding, so mom may need assistance with this.   Set up DEBP on Initiate setting. Assembling, disassembling and cleaning of pump parts demonstrated. Enc mom to pump every 3 hours post BF and follow with hand expression. Mom to clean pump parts after each use. Mom was pumping when LC left room. Breast milk storage at room temperature reviewed with mom.   Plan of care written on board and reviewed with Mom.  1. Offer breast with feeding cues with no longer that 3 hours between feedings. If not willing to BF within 15 minutes, stop and supplement infant.  2. Hand express and offer spoon feeding first if infant sleepy and will not latch.  3. Use # 20 nipple shield with feedings 4. Follow breast feeding with supplementation of EBM and/or Neocare 22 cal using the spoon or curved tip syringe and finger feeding per amounts on LPT infant handout 5. Pump for 15 minutes on Initiate setting with DEBP post BF 6. Hand Express both breasts at least 2-3 minutes on each side post pumping 7. Rest between feeds 8. Call for assistance as needed.   Report and plan of care to [facility], RN.   Maternal Data Has patient been taught Hand Expression?: Yes Does the patient have breastfeeding experience prior to this delivery?: Yes  Feeding Feeding Type: Breast Fed Length of feed: 5 min  LATCH Score Latch: Repeated attempts needed to sustain latch, nipple held  in mouth throughout feeding, stimulation needed to elicit sucking reflex.  Audible Swallowing: A few with stimulation  Type of Nipple: Everted at rest and after stimulation  Comfort (Breast/Nipple): Soft / non-tender  Hold (Positioning): Assistance needed to correctly position infant at breast and maintain latch.  LATCH Score: 7  Interventions Interventions: Breast feeding basics reviewed;Support pillows;Assisted with latch;Position options;Skin to skin;Expressed milk;Breast massage;Breast compression;Pre-pump  if needed;Hand express;DEBP  Lactation Tools Discussed/Used Tools: Pump;Coconut oil;Nipple Shields Nipple shield size: 20 Breast pump type: Double-Electric Breast Pump WIC Program: No Pump Review: Setup, frequency, and cleaning;Milk Storage Initiated by:: Noralee StainSharon Aayana Reinertsen, RN, IBCLC Date initiated:: 08/23/17   Consult Status Consult Status: Follow-up Date: 08/24/17 Follow-up type: In-patient    Silas FloodSharon S Burr Soffer 08/23/2017, 3:47 PM

## 2017-08-23 NOTE — Progress Notes (Signed)
Patient in shower.  Will round later.   Faith HonourMegan Ashyla Luth, DO

## 2017-08-23 NOTE — Progress Notes (Signed)
Post Partum Day 1 Subjective: no complaints, up ad lib, voiding and tolerating PO  Objective: Blood pressure 110/60, pulse 89, temperature 98.1 F (36.7 C), temperature source Oral, resp. rate 18, height 5\' 7"  (1.702 m), weight 237 lb 12.8 oz (107.9 kg), last menstrual period 12/06/2016, SpO2 100 %, unknown if currently breastfeeding.  Physical Exam:  General: alert, cooperative and appears stated age Lochia: appropriate Uterine Fundus: firm Incision: n/a DVT Evaluation: No evidence of DVT seen on physical exam. Negative Homan's sign. No cords or calf tenderness.  Recent Labs    08/22/17 0127 08/23/17 0515  HGB 11.8* 11.2*  HCT 34.4* 33.2*    Assessment/Plan: Plan for discharge tomorrow, Breastfeeding and Circumcision prior to discharge  Patient counseled for circ including risk of bleeding, infection and scarring.  All questions were answered and the patient wishes to proceed.   LOS: 1 day   Faith Hanson 08/23/2017, 12:42 PM

## 2017-08-24 MED ORDER — NORETHINDRONE 0.35 MG PO TABS: 1.0000 | ORAL_TABLET | Freq: Every day | ORAL | 1 refills | Status: DC

## 2017-08-24 NOTE — Lactation Note (Signed)
This note was copied from a baby's chart. Lactation Consultation Note  Patient Name: Boy Ellis ParentsShontee Evans ZOXWR'UToday's Date: 08/24/2017 Reason for consult: Follow-up assessment  Visited with Mom on day of discharge, baby 4 lbs 13.6 oz SGA baby born at 5137w 0d.  Baby 43 hrs old, and at 5% weight loss.   Mom has been breast feeding using a nipple shield with little supplement occasionally of EBM or 22 cal formula using curved tip syringe.  Baby cueing, so offered to assist and assess a latch.  Mom placed the 20 mm nipple shield well, nipple short and doesn't pull well into shield.  Mom leaning into baby using nipple shield to force baby to open widely.  Baby showing little interest, with occasional sucking noted.  Using a 5 fr feeding tube under nipple shield (changed to 16 mm), baby became more vigorous sucking and swallowing.  Baby's mouth small and sucks tightly onto nipple of shield.  Teaching done on what a deep latch to breast would look like.  Explained to Mom the importance of regular double pumping (Mom last pumped yesterday), and care and cleaning of pump parts.  Mom has a Medela pump at home.    Talked about importance of offering supplement 10-20 ml today, and 20-30 tomorrow.  Using formula added to EBM to equal amounts needed.  Mom to keep baby STS as much as possible, feeding baby every 3 hrs or sooner if he cues to eat.  Mom understanding recommendations.   Engorgement prevention and treatment reviewed.    Mom interested in Lactation follow up.  Basket message sent.    Mom knows to call prn for assistance.   Feeding Feeding Type: Breast Fed Length of feed: 20 min  LATCH Score Latch: Repeated attempts needed to sustain latch, nipple held in mouth throughout feeding, stimulation needed to elicit sucking reflex.  Audible Swallowing: Spontaneous and intermittent(only with formula supplement at the breast)  Type of Nipple: Everted at rest and after stimulation(small short shafted  nipples)  Comfort (Breast/Nipple): Soft / non-tender  Hold (Positioning): Assistance needed to correctly position infant at breast and maintain latch.  LATCH Score: 8  Interventions Interventions: Breast feeding basics reviewed;Assisted with latch;Skin to skin;Breast massage;Hand express;Breast compression;Adjust position;Support pillows;Position options;Coconut oil;DEBP  Lactation Tools Discussed/Used Tools: Coconut oil;19F feeding tube / Syringe;Nipple Shields Nipple shield size: 16 Breast pump type: Double-Electric Breast Pump   Consult Status Consult Status: Complete Date: 09/01/17(OP request made) Follow-up type: Out-patient    Judee ClaraSmith, Silverio Hagan E 08/24/2017, 10:02 AM

## 2017-08-24 NOTE — Discharge Summary (Signed)
Obstetric Discharge Summary Reason for Admission: induction of labor s/s IUGR + oligo Prenatal Procedures: none Intrapartum Procedures: spontaneous vaginal delivery Postpartum Procedures: none Complications-Operative and Postpartum: none Hemoglobin  Date Value Ref Range Status  08/23/2017 11.2 (L) 12.0 - 15.0 g/dL Final   HCT  Date Value Ref Range Status  08/23/2017 33.2 (L) 36.0 - 46.0 % Final    Physical Exam:  General: alert, cooperative and appears stated age 65Lochia: appropriate Uterine Fundus: firm Incision: healing well, no significant drainage, no dehiscence, no significant erythema DVT Evaluation: No evidence of DVT seen on physical exam. Negative Homan's sign. No cords or calf tenderness. No significant calf/ankle edema.  Discharge Diagnoses: Term Pregnancy-delivered  Discharge Information: Date: 08/24/2017 Activity: pelvic rest Diet: routine Medications: None Condition: stable Instructions: refer to practice specific booklet Discharge to: home   Newborn Data: Live born female  Birth Weight: 5 lb 2 oz (2325 g) APGAR: 9, 9  Newborn Delivery   Birth date/time:  08/22/2017 14:55:00 Delivery type:  VBAC, Spontaneous     Home with mother.  Faith Hanson 08/24/2017, 9:30 AM

## 2018-03-16 DIAGNOSIS — Z6834 Body mass index (BMI) 34.0-34.9, adult: Secondary | ICD-10-CM | POA: Diagnosis not present

## 2018-03-16 DIAGNOSIS — R635 Abnormal weight gain: Secondary | ICD-10-CM | POA: Diagnosis not present

## 2018-03-16 DIAGNOSIS — Z713 Dietary counseling and surveillance: Secondary | ICD-10-CM | POA: Diagnosis not present

## 2018-05-30 ENCOUNTER — Encounter (INDEPENDENT_AMBULATORY_CARE_PROVIDER_SITE_OTHER): Payer: Self-pay

## 2018-06-05 ENCOUNTER — Encounter (INDEPENDENT_AMBULATORY_CARE_PROVIDER_SITE_OTHER): Payer: Self-pay | Admitting: Bariatrics

## 2018-06-05 ENCOUNTER — Ambulatory Visit (INDEPENDENT_AMBULATORY_CARE_PROVIDER_SITE_OTHER): Payer: Managed Care, Other (non HMO) | Admitting: Bariatrics

## 2018-06-05 VITALS — BP 112/71 | HR 84 | Temp 98.2°F | Ht 68.0 in | Wt 223.0 lb

## 2018-06-05 DIAGNOSIS — R0602 Shortness of breath: Secondary | ICD-10-CM

## 2018-06-05 DIAGNOSIS — E669 Obesity, unspecified: Secondary | ICD-10-CM

## 2018-06-05 DIAGNOSIS — Z1331 Encounter for screening for depression: Secondary | ICD-10-CM | POA: Diagnosis not present

## 2018-06-05 DIAGNOSIS — R5383 Other fatigue: Secondary | ICD-10-CM | POA: Diagnosis not present

## 2018-06-05 DIAGNOSIS — Z6833 Body mass index (BMI) 33.0-33.9, adult: Secondary | ICD-10-CM

## 2018-06-05 DIAGNOSIS — Z9189 Other specified personal risk factors, not elsewhere classified: Secondary | ICD-10-CM | POA: Diagnosis not present

## 2018-06-05 DIAGNOSIS — Z0289 Encounter for other administrative examinations: Secondary | ICD-10-CM

## 2018-06-06 ENCOUNTER — Encounter (INDEPENDENT_AMBULATORY_CARE_PROVIDER_SITE_OTHER): Payer: Self-pay | Admitting: Bariatrics

## 2018-06-06 ENCOUNTER — Telehealth (INDEPENDENT_AMBULATORY_CARE_PROVIDER_SITE_OTHER): Payer: Self-pay | Admitting: Bariatrics

## 2018-06-06 DIAGNOSIS — R7989 Other specified abnormal findings of blood chemistry: Secondary | ICD-10-CM | POA: Insufficient documentation

## 2018-06-06 LAB — COMPREHENSIVE METABOLIC PANEL
A/G RATIO: 1.4 (ref 1.2–2.2)
ALK PHOS: 85 IU/L (ref 39–117)
ALT: 11 IU/L (ref 0–32)
AST: 15 IU/L (ref 0–40)
Albumin: 4.5 g/dL (ref 3.5–5.5)
BUN/Creatinine Ratio: 14 (ref 9–23)
BUN: 13 mg/dL (ref 6–20)
Bilirubin Total: 0.4 mg/dL (ref 0.0–1.2)
CO2: 24 mmol/L (ref 20–29)
Calcium: 9.4 mg/dL (ref 8.7–10.2)
Chloride: 97 mmol/L (ref 96–106)
Creatinine, Ser: 0.93 mg/dL (ref 0.57–1.00)
GFR calc Af Amer: 93 mL/min/{1.73_m2} (ref >59)
GFR, EST NON AFRICAN AMERICAN: 80 mL/min/{1.73_m2} (ref >59)
GLOBULIN, TOTAL: 3.2 g/dL (ref 1.5–4.5)
Glucose: 79 mg/dL (ref 65–99)
POTASSIUM: 4.4 mmol/L (ref 3.5–5.2)
SODIUM: 136 mmol/L (ref 134–144)
Total Protein: 7.7 g/dL (ref 6.0–8.5)

## 2018-06-06 LAB — TSH: TSH: 0.4 u[IU]/mL — ABNORMAL LOW (ref 0.450–4.500)

## 2018-06-06 LAB — T3: T3 TOTAL: 280 ng/dL — AB (ref 71–180)

## 2018-06-06 LAB — LIPID PANEL
CHOL/HDL RATIO: 2 ratio (ref 0.0–4.4)
Cholesterol, Total: 129 mg/dL (ref 100–199)
HDL: 63 mg/dL (ref >39)
LDL CALC: 59 mg/dL (ref 0–99)
Triglycerides: 37 mg/dL (ref 0–149)
VLDL Cholesterol Cal: 7 mg/dL (ref 5–40)

## 2018-06-06 LAB — HEMOGLOBIN A1C
ESTIMATED AVERAGE GLUCOSE: 105 mg/dL
HEMOGLOBIN A1C: 5.3 % (ref 4.8–5.6)

## 2018-06-06 LAB — T4, FREE: Free T4: 3.31 ng/dL — ABNORMAL HIGH (ref 0.82–1.77)

## 2018-06-06 LAB — VITAMIN D 25 HYDROXY (VIT D DEFICIENCY, FRACTURES): VIT D 25 HYDROXY: 21.1 ng/mL — AB (ref 30.0–100.0)

## 2018-06-06 LAB — INSULIN, RANDOM: INSULIN: 1.2 u[IU]/mL — ABNORMAL LOW (ref 2.6–24.9)

## 2018-06-06 NOTE — Telephone Encounter (Signed)
Phone call to patient regarding lab work. Message left for patient to call back.

## 2018-06-08 NOTE — Progress Notes (Signed)
Office: [phone]  /  Fax: [phone]   Dear Dr. Candice Hanson,   Thank you for referring Faith Hanson to our clinic. The following note includes my evaluation and treatment recommendations.  HPI:   Chief Complaint: OBESITY    Faith Hanson has been referred by Dr. Candice Hanson for consultation regarding her obesity and obesity related comorbidities.    Faith Hanson (MR# [id number]) is a 32 y.o. female who presents on 06/05/2018 for obesity evaluation and treatment. Current BMI is Body mass index is 33.91 kg/m.Marland Kitchen Faith Hanson has been struggling with her weight for many years and has been unsuccessful in either losing weight, maintaining weight loss, or reaching her healthy weight goal. She sometimes struggles with portion and state that she does do meal planning.     Faith Hanson attended our information session and states she is currently in the action stage of change and ready to dedicate time achieving and maintaining a healthier weight. Faith Hanson is interested in becoming our patient and working on intensive lifestyle modifications including (but not limited to) diet, exercise and weight loss.  Faith Hanson states her family eats meals together she thinks her family will eat healthier with her her desired weight loss is 43 lbs she started gaining weight after her 1st child her heaviest weight ever was 223 lbs today she has significant food cravings issues  she is frequently drinking liquids with calories she struggles with emotional eating she frequently eats larger portions than normal   Fatigue Faith Hanson feels her energy is lower than it should be. This has worsened with weight gain and has not worsened recently. Faith Hanson admits to daytime somnolence and admits to waking up still tired. Patient is at risk for obstructive sleep apnea. Patent has a history of symptoms of daytime fatigue. Patient generally gets 7 hours of sleep per night, and states they generally have generally restful  sleep. Snoring is present. Apneic episodes are not present. Epworth Sleepiness Score is 3.  Dyspnea on exertion Fawn notes increasing shortness of breath with certain activities and seems to be worsening over time with weight gain. She notes getting out of breath sooner with activity than she used to. This has not gotten worse recently. Faith Hanson denies orthopnea.  At risk for diabetes Faith Hanson is at higher than average risk for developing diabetes due to her obesity. She currently denies polyuria or polydipsia.  Depression Screen Faith Hanson's Food and Mood (modified PHQ-9) score was  Depression screen PHQ 2/9 06/05/2018  Decreased Interest 1  Down, Depressed, Hopeless 2  PHQ - 2 Score 3  Altered sleeping 0  Tired, decreased energy 3  Change in appetite 1  Feeling bad or failure about yourself  1  Trouble concentrating 1  Moving slowly or fidgety/restless 0  Suicidal thoughts 0  PHQ-9 Score 9  Difficult doing work/chores Not difficult at all    ALLERGIES: No Known Allergies  MEDICATIONS: Current Outpatient Medications on File Prior to Visit  Medication Sig Dispense Refill  . levonorgestrel (MIRENA) 20 MCG/24HR IUD 1 each by Intrauterine route once.     No current facility-administered medications on file prior to visit.     PAST MEDICAL HISTORY: Past Medical History:  Diagnosis Date  . Medical history non-contributory   . Vaginal Pap smear, abnormal     PAST SURGICAL HISTORY: Past Surgical History:  Procedure Laterality Date  . CESAREAN SECTION    . LEEP      SOCIAL HISTORY: Social History   Tobacco Use  .  Smoking status: Never Smoker  . Smokeless tobacco: Never Used  Substance Use Topics  . Alcohol use: Yes    Comment: rarely   . Drug use: No    FAMILY HISTORY: Family History  Problem Relation Age of Onset  . Diabetes Maternal Grandmother   . Hyperlipidemia Mother   . Obesity Mother   . Anemia Sister   . Thyroid disease Sister   . Colon cancer  Maternal Aunt     ROS: Review of Systems  Constitutional: Positive for malaise/fatigue. Negative for weight loss.  Respiratory:       Positive for shortness of breath on exertion.  Cardiovascular: Negative for orthopnea.  Genitourinary:       Negative for polyuria.  Endo/Heme/Allergies: Negative for polydipsia.    PHYSICAL EXAM: Blood pressure 112/71, pulse 84, temperature 98.2 F (36.8 C), temperature source Oral, height 5\' 8"  (1.727 m), weight 223 lb (101.2 kg), SpO2 100 %, unknown if currently breastfeeding. Body mass index is 33.91 kg/m. Physical Exam  Constitutional: She is oriented to person, place, and time. She appears well-developed and well-nourished.  HENT:  Head: Normocephalic and atraumatic.  Nose: Nose normal.  Eyes: EOM are normal. No scleral icterus.  Neck: Normal range of motion. Neck supple. No thyromegaly present.  Cardiovascular: Normal rate and regular rhythm.  Pulmonary/Chest: Effort normal. No respiratory distress.  Abdominal: Soft. There is no tenderness.  + obesity  Musculoskeletal:  Range of Motion normal in all 4 extremities  Neurological: She is alert and oriented to person, place, and time. Coordination normal.  Skin: Skin is warm and dry.  Psychiatric: She has a normal mood and affect. Her behavior is normal.  Vitals reviewed.   RECENT LABS AND TESTS: BMET    Component Value Date/Time   NA 136 06/05/2018 1017   K 4.4 06/05/2018 1017   CL 97 06/05/2018 1017   CO2 24 06/05/2018 1017   GLUCOSE 79 06/05/2018 1017   GLUCOSE 115 (H) 07/15/2010 0555   BUN 13 06/05/2018 1017   CREATININE 0.93 06/05/2018 1017   CALCIUM 9.4 06/05/2018 1017   GFRNONAA 80 06/05/2018 1017   GFRAA 93 06/05/2018 1017   Lab Results  Component Value Date   HGBA1C 5.3 06/05/2018   Lab Results  Component Value Date   INSULIN 1.2 (L) 06/05/2018   CBC    Component Value Date/Time   WBC 10.7 (H) 08/23/2017 0515   RBC 3.75 (L) 08/23/2017 0515   HGB 11.2 (L)  08/23/2017 0515   HCT 33.2 (L) 08/23/2017 0515   PLT 242 08/23/2017 0515   MCV 88.5 08/23/2017 0515   MCH 29.9 08/23/2017 0515   MCHC 33.7 08/23/2017 0515   RDW 15.0 08/23/2017 0515   Iron/TIBC/Ferritin/ %Sat No results found for: IRON, TIBC, FERRITIN, IRONPCTSAT Lipid Panel     Component Value Date/Time   CHOL 129 06/05/2018 1017   TRIG 37 06/05/2018 1017   HDL 63 06/05/2018 1017   CHOLHDL 2.0 06/05/2018 1017   LDLCALC 59 06/05/2018 1017   Hepatic Function Panel     Component Value Date/Time   PROT 7.7 06/05/2018 1017   ALBUMIN 4.5 06/05/2018 1017   AST 15 06/05/2018 1017   ALT 11 06/05/2018 1017   ALKPHOS 85 06/05/2018 1017   BILITOT 0.4 06/05/2018 1017      Component Value Date/Time   TSH 0.400 (L) 06/05/2018 1017    ECG  shows NSR with a rate of 84 INDIRECT CALORIMETER done today shows a VO2 of 178  and a REE of 1238.  Her calculated basal metabolic rate is 1610 thus her basal metabolic rate is worse than expected.  ASSESSMENT AND PLAN: Other fatigue - Plan: EKG 12-Lead, Comprehensive metabolic panel, Hemoglobin A1c, Insulin, random, Lipid panel, VITAMIN D 25 Hydroxy (Vit-D Deficiency, Fractures), TSH, T4, free, T3  Shortness of breath on exertion  At risk for diabetes mellitus  Screening for depression  Class 1 obesity with serious comorbidity and body mass index (BMI) of 33.0 to 33.9 in adult, unspecified obesity type  PLAN: Fatigue Chandy was informed that her fatigue may be related to obesity, depression or many other causes. Labs will be ordered, and in the meanwhile Laquilla has agreed to work on diet, exercise and weight loss to help with fatigue. Proper sleep hygiene was discussed including the need for 7-8 hours of quality sleep each night. A sleep study was not ordered based on symptoms and Epworth score. We will check her vitamin D and TSH, T3, and T4 today.  Dyspnea on exertion Demara's shortness of breath appears to be obesity related and exercise  induced. She has agreed to work on weight loss and gradually increase exercise to treat her exercise induced shortness of breath. If Cyndi follows our instructions and loses weight without improvement of her shortness of breath, we will plan to refer to pulmonology. We will monitor this condition regularly. Eliyanna agrees to this plan.  Diabetes risk counseling Idaly was given extended (15 minutes) diabetes prevention counseling today. She is 32 y.o. female and has risk factors for diabetes including obesity. We discussed intensive lifestyle modifications today with an emphasis on weight loss as well as increasing exercise and decreasing simple carbohydrates in her diet.  Depression Screen Inetha had a mildly positive depression screening. Depression is commonly associated with obesity and often results in emotional eating behaviors. We will monitor this closely and work on CBT to help improve the non-hunger eating patterns. Referral to Psychology may be required if no improvement is seen as she continues in our clinic.  Obesity Syanna is currently in the action stage of change and her goal is to continue with weight loss efforts. I recommend Fred begin the structured treatment plan as follows:  She has agreed to follow the Category 2 plan. She will continue with meal planning and increase her water intake. Siria has been instructed to eventually work up to a goal of 150 minutes of combined cardio and strengthening exercise per week for weight loss and overall health benefits. We discussed the following Behavioral Modification Stratagies today: increasing lean protein intake, decreasing simple carbohydrates, increasing vegetables, increase H2O intake, decrease eating out, no skipping meals, keeping healthy foods in the home, and work on meal planning and easy cooking plans.  She was informed of the importance of frequent follow up visits to maximize her success with intensive lifestyle  modifications for her multiple health conditions. She was informed we would discuss her lab results at her next visit unless there is a critical issue that needs to be addressed sooner.  Kyera agreed to keep her next visit at the agreed upon time to discuss these results.   OBESITY BEHAVIORAL INTERVENTION VISIT  Today's visit was # 1   Starting weight: 223 lbs Starting date: 06/05/18 Today's weight : Weight: 223 lb (101.2 kg)  Today's date: 06/05/2018 Total lbs lost to date: 0  ASK: We discussed the diagnosis of obesity with [name] today and Azucena agreed to give [place] permission to discuss obesity  behavioral modification therapy today.  ASSESS: [name] has the diagnosis of obesity and her BMI today is 33.9. Analis is in the action stage of change.   ADVISE: [name] was educated on the multiple health risks of obesity as well as the benefit of weight loss to improve her health. She was advised of the need for long term treatment and the importance of lifestyle modifications to improve her current health and to decrease her risk of future health problems.  AGREE: Multiple dietary modification options and treatment options were discussed and Annis agreed to follow the recommendations documented in the above note.  ARRANGE: Jenia was educated on the importance of frequent visits to treat obesity as outlined per CMS and USPSTF guidelines and agreed to schedule her next follow up appointment today.  I, Kirke Corinara Soares, am acting as Energy managertranscriptionist for [employer] [place] Corporation[employer]el A. Manson PasseyBrown, DO  I have reviewed the above documentation for accuracy and completeness, and I agree with the above. -Corinna CapraAngel Xyler Terpening, DO

## 2018-06-12 ENCOUNTER — Encounter (INDEPENDENT_AMBULATORY_CARE_PROVIDER_SITE_OTHER): Payer: Self-pay | Admitting: Bariatrics

## 2018-06-19 ENCOUNTER — Ambulatory Visit (INDEPENDENT_AMBULATORY_CARE_PROVIDER_SITE_OTHER): Payer: Managed Care, Other (non HMO) | Admitting: Bariatrics

## 2018-06-19 ENCOUNTER — Encounter (INDEPENDENT_AMBULATORY_CARE_PROVIDER_SITE_OTHER): Payer: Self-pay | Admitting: Bariatrics

## 2018-06-19 ENCOUNTER — Other Ambulatory Visit (INDEPENDENT_AMBULATORY_CARE_PROVIDER_SITE_OTHER): Payer: Self-pay | Admitting: Bariatrics

## 2018-06-19 VITALS — BP 114/76 | HR 81 | Temp 98.3°F | Ht 68.0 in | Wt 220.0 lb

## 2018-06-19 DIAGNOSIS — Z9189 Other specified personal risk factors, not elsewhere classified: Secondary | ICD-10-CM | POA: Diagnosis not present

## 2018-06-19 DIAGNOSIS — E559 Vitamin D deficiency, unspecified: Secondary | ICD-10-CM

## 2018-06-19 DIAGNOSIS — Z6833 Body mass index (BMI) 33.0-33.9, adult: Secondary | ICD-10-CM

## 2018-06-19 DIAGNOSIS — E038 Other specified hypothyroidism: Secondary | ICD-10-CM

## 2018-06-19 DIAGNOSIS — E669 Obesity, unspecified: Secondary | ICD-10-CM

## 2018-06-19 MED ORDER — VITAMIN D (ERGOCALCIFEROL) 1.25 MG (50000 UNIT) PO CAPS: 50000.0000 [IU] | ORAL_CAPSULE | ORAL | 0 refills | Status: DC

## 2018-06-22 NOTE — Progress Notes (Signed)
Office: [phone]  /  Fax: [phone]   HPI:   Chief Complaint: OBESITY Faith Hanson is here to discuss her progress with her obesity treatment plan. She is on the Category 2 plan and is following her eating plan approximately 90 % of the time. She states she is exercising 0 minutes 0 times per week. Faith Hanson did not find it hard to follow the Category 2 plan. She did not feel hungry on the plan. Faith Hanson did not have cravings. Her weight is 220 lb (99.8 kg) today and has had a weight loss of 3 pounds over a period of 2 weeks since her last visit. She has lost 3 lbs since starting treatment with [place].  Vitamin D deficiency Faith Hanson has a diagnosis of vitamin D deficiency. She is not currently taking vit D. Her vitamin D level is at 21.1 and she denies nausea, vomiting or muscle weakness.  At risk for osteopenia and osteoporosis Faith Hanson is at higher risk of osteopenia and osteoporosis due to vitamin D deficiency.   TSH deficiency Faith Hanson has a TSH level of 0.4 and elevated T3 and T4. She has no previous history or family history of thyroid issues. Faith Hanson denies hot or cold intolerance or heart palpitations.  ALLERGIES: No Known Allergies  MEDICATIONS: Current Outpatient Medications on File Prior to Visit  Medication Sig Dispense Refill   levonorgestrel (MIRENA) 20 MCG/24HR IUD 1 each by Intrauterine route once.     No current facility-administered medications on file prior to visit.     PAST MEDICAL HISTORY: Past Medical History:  Diagnosis Date   Medical history non-contributory    Vaginal Pap smear, abnormal     PAST SURGICAL HISTORY: Past Surgical History:  Procedure Laterality Date   CESAREAN SECTION     LEEP      SOCIAL HISTORY: Social History   Tobacco Use   Smoking status: Never Smoker   Smokeless tobacco: Never Used  Substance Use Topics   Alcohol use: Yes    Comment: rarely    Drug use: No    FAMILY HISTORY: Family History  Problem Relation  Age of Onset   Diabetes Maternal Grandmother    Hyperlipidemia Mother    Obesity Mother    Anemia Sister    Thyroid disease Sister    Colon cancer Maternal Aunt     ROS: Review of Systems  Constitutional: Positive for weight loss.  Cardiovascular: Negative for palpitations.  Gastrointestinal: Negative for nausea and vomiting.  Musculoskeletal:       Negative for muscle weakness  Endo/Heme/Allergies:       Negative for cravings Negative for polyphagia Negative for heat or cold intolerance    PHYSICAL EXAM: Blood pressure 114/76, pulse 81, temperature 98.3 F (36.8 C), temperature source Oral, height 5\' 8"  (1.727 m), weight 220 lb (99.8 kg), SpO2 100 %, unknown if currently breastfeeding. Body mass index is 33.45 kg/m. Physical Exam  Constitutional: She is oriented to person, place, and time. She appears well-developed and well-nourished.  Cardiovascular: Normal rate.  Pulmonary/Chest: Effort normal.  Musculoskeletal: Normal range of motion.  Neurological: She is oriented to person, place, and time.  Skin: Skin is warm and dry.  Psychiatric: She has a normal mood and affect. Her behavior is normal.  Vitals reviewed.   RECENT LABS AND TESTS: BMET    Component Value Date/Time   NA 136 06/05/2018 1017   K 4.4 06/05/2018 1017   CL 97 06/05/2018 1017   CO2 24 06/05/2018 1017   GLUCOSE  79 06/05/2018 1017   GLUCOSE 115 (H) 07/15/2010 0555   BUN 13 06/05/2018 1017   CREATININE 0.93 06/05/2018 1017   CALCIUM 9.4 06/05/2018 1017   GFRNONAA 80 06/05/2018 1017   GFRAA 93 06/05/2018 1017   Lab Results  Component Value Date   HGBA1C 5.3 06/05/2018   Lab Results  Component Value Date   INSULIN 1.2 (L) 06/05/2018   CBC    Component Value Date/Time   WBC 10.7 (H) 08/23/2017 0515   RBC 3.75 (L) 08/23/2017 0515   HGB 11.2 (L) 08/23/2017 0515   HCT 33.2 (L) 08/23/2017 0515   PLT 242 08/23/2017 0515   MCV 88.5 08/23/2017 0515   MCH 29.9 08/23/2017 0515   MCHC  33.7 08/23/2017 0515   RDW 15.0 08/23/2017 0515   Iron/TIBC/Ferritin/ %Sat No results found for: IRON, TIBC, FERRITIN, IRONPCTSAT Lipid Panel     Component Value Date/Time   CHOL 129 06/05/2018 1017   TRIG 37 06/05/2018 1017   HDL 63 06/05/2018 1017   CHOLHDL 2.0 06/05/2018 1017   LDLCALC 59 06/05/2018 1017   Hepatic Function Panel     Component Value Date/Time   PROT 7.7 06/05/2018 1017   ALBUMIN 4.5 06/05/2018 1017   AST 15 06/05/2018 1017   ALT 11 06/05/2018 1017   ALKPHOS 85 06/05/2018 1017   BILITOT 0.4 06/05/2018 1017      Component Value Date/Time   TSH 0.400 (L) 06/05/2018 1017   Results for Faith Hanson (MRN [id number]) as of 06/22/2018 10:20  Ref. Range 06/05/2018 10:17  Vitamin D, 25-Hydroxy Latest Ref Range: 30.0 - 100.0 ng/mL 21.1 (L)   ASSESSMENT AND PLAN: Vitamin D deficiency - Plan: Vitamin D, Ergocalciferol, (DRISDOL) 1.25 MG (50000 UT) CAPS capsule  TSH deficiency - Plan: Ambulatory referral to Endocrinology  At risk for osteoporosis  Class 1 obesity with serious comorbidity and body mass index (BMI) of 33.0 to 33.9 in adult, unspecified obesity type  PLAN:  Vitamin D Deficiency Faith Hanson was informed that low vitamin D levels contributes to fatigue and are associated with obesity, breast, and colon cancer. She agrees to continue to take prescription Vit D @50 ,000 IU every week #4 with no refills and will follow up for routine testing of vitamin D, at least 2-3 times per year. She was informed of the risk of over-replacement of vitamin D and agrees to not increase her dose unless she discusses this with [place] first. Faith Hanson agrees to follow up as directed.  At risk for osteopenia and osteoporosis Faith Hanson was given extended  (15 minutes) osteoporosis prevention counseling today. Faith Hanson is at risk for osteopenia and osteoporosis due to her vitamin D deficiency. She was encouraged to take her vitamin D and follow her higher calcium diet and increase  strengthening exercise to help strengthen her bones and decrease her risk of osteopenia and osteoporosis.  TSH deficiency We will refer Faith Hanson to the endocrinologist (in network with [employer]) and she will follow up with our clinic in 2 weeks.  Obesity Faith Hanson is currently in the action stage of change. As such, her goal is to continue with weight loss efforts She has agreed to follow the Category 2 plan Naileah has been instructed to work up to a goal of 150 minutes of combined cardio and strengthening exercise per week for weight loss and overall health benefits. We discussed the following Behavioral Modification Strategies today: increase H2O intake, no skipping meals, more meal planning,  increasing lean protein intake, decreasing simple carbohydrates ,  increasing vegetables and decrease eating out  [name] has agreed to follow up with our clinic in 2 weeks. She was informed of the importance of frequent follow up visits to maximize her success with intensive lifestyle modifications for her multiple health conditions.   OBESITY BEHAVIORAL INTERVENTION VISIT  Today's visit was # 2   Starting weight: 223 lbs Starting date: 06/05/2018 Today's weight : 220 lbs Today's date: 06/19/2018 Total lbs lost to date: 3   ASK: We discussed the diagnosis of obesity with Heath LarkShontee M Lua today and Meygan agreed to give us permission to discuss obesity behavioral modification therapy today.  ASSESS: [name] has the diagnosis of obesity and her BMI today is 33.46 [name] is in the action stage of change   ADVISE: [name] was educated on the multiple health risks of obesity as well as the benefit of weight loss to improve her health. She was advised of the need for long term treatment and the importance of lifestyle modifications to improve her current health and to decrease her risk of future health problems.  AGREE: Multiple dietary modification options and treatment options were discussed  and  Ellina agreed to follow the recommendations documented in the above note.  ARRANGE: Montasia was educated on the importance of frequent visits to treat obesity as outlined per CMS and USPSTF guidelines and agreed to schedule her next follow up appointment today.  Cristi LoronI, Joanne Murray, am acting as Energy managertranscriptionist for [employer]. Manson PasseyBrown, DO  I have reviewed the above documentation for accuracy and completeness, and I agree with the above. -Corinna CapraAngel Brown, DO

## 2018-07-03 ENCOUNTER — Ambulatory Visit (INDEPENDENT_AMBULATORY_CARE_PROVIDER_SITE_OTHER): Payer: Managed Care, Other (non HMO) | Admitting: Bariatrics

## 2018-07-03 ENCOUNTER — Encounter (INDEPENDENT_AMBULATORY_CARE_PROVIDER_SITE_OTHER): Payer: Self-pay | Admitting: Bariatrics

## 2018-07-03 VITALS — BP 113/74 | HR 87 | Temp 98.0°F | Ht 68.0 in | Wt 218.0 lb

## 2018-07-03 DIAGNOSIS — Z6833 Body mass index (BMI) 33.0-33.9, adult: Secondary | ICD-10-CM | POA: Diagnosis not present

## 2018-07-03 DIAGNOSIS — E669 Obesity, unspecified: Secondary | ICD-10-CM | POA: Diagnosis not present

## 2018-07-03 DIAGNOSIS — R7989 Other specified abnormal findings of blood chemistry: Secondary | ICD-10-CM | POA: Diagnosis not present

## 2018-07-03 DIAGNOSIS — E559 Vitamin D deficiency, unspecified: Secondary | ICD-10-CM

## 2018-07-04 DIAGNOSIS — E669 Obesity, unspecified: Secondary | ICD-10-CM | POA: Insufficient documentation

## 2018-07-04 DIAGNOSIS — E6609 Other obesity due to excess calories: Secondary | ICD-10-CM | POA: Insufficient documentation

## 2018-07-04 DIAGNOSIS — Z6832 Body mass index (BMI) 32.0-32.9, adult: Secondary | ICD-10-CM

## 2018-07-04 NOTE — Progress Notes (Signed)
Office: [phone]  /  Fax: [phone]   HPI:   Chief Complaint: OBESITY Faith Hanson is here to discuss her progress with her obesity treatment plan. She is on the Category 2 plan and is following her eating plan approximately 90 % of the time. She states she is exercising 0 minutes 0 times per week. Faith Hanson is doing well overall. She denies any specific struggles. Her weight is 218 lb (98.9 kg) today and has had a weight loss of 2 pounds over a period of 2 weeks since her last visit. She has lost 5 lbs since starting treatment with us.  Vitamin D deficiency Denys has a diagnosis of vitamin D deficiency. She is on high dose prescription vit D (has not started) and denies nausea, vomiting or muscle weakness.  Low TSH Faith Hanson has a low TSH level at 0.400 and she was referred to endocrinologist.   ASSESSMENT AND PLAN:  Vitamin D deficiency  Other specified hypothyroidism  Class 1 obesity with serious comorbidity and body mass index (BMI) of 33.0 to 33.9 in adult, unspecified obesity type  PLAN:  Vitamin D Deficiency Faith Hanson was informed that low vitamin D levels contributes to fatigue and are associated with obesity, breast, and colon cancer. She will begin prescription Vit D @50 ,000 IU every week (has prescription) and will follow up for routine testing of vitamin D, at least 2-3 times per year. She was informed of the risk of over-replacement of vitamin D and agrees to not increase her dose unless she discusses this with us first.  Low TSH Endocrinologist office called and patient will call for an appointment. [name] will follow up with our clinic in 2 weeks.  I spent > than 50% of the 15 minute visit on counseling as documented in the note.  Obesity [name] is currently in the action stage of change. As such, her goal is to continue with weight loss efforts She has agreed to follow the Category 2 plan Faith Hanson will begin to walk and do the treadmill for 15 minutes, several  times per week for weight loss and overall health benefits. We discussed the following Behavioral Modification Strategies today: increase H2O intake, keeping healthy foods in the home, no skipping meals, increasing lean protein intake, decreasing simple carbohydrates , increasing vegetables and work on meal planning and easy cooking plans  Faith Hanson will continue with healthy snacks. She will continue to read nutrition labels. "Fruit" handout was provided to patient today. Additional Category 1 and Category 2 breakfast options were given to patient today.  Faith Hanson has agreed to follow up with our clinic in 2 weeks. She was informed of the importance of frequent follow up visits to maximize her success with intensive lifestyle modifications for her multiple health conditions.  ALLERGIES: No Known Allergies  MEDICATIONS: Current Outpatient Medications on File Prior to Visit  Medication Sig Dispense Refill  . levonorgestrel (MIRENA) 20 MCG/24HR IUD 1 each by Intrauterine route once.    . Vitamin D, Ergocalciferol, (DRISDOL) 1.25 MG (50000 UT) CAPS capsule Take 1 capsule (50,000 Units total) by mouth every 7 (seven) days. 4 capsule 0   No current facility-administered medications on file prior to visit.     PAST MEDICAL HISTORY: Past Medical History:  Diagnosis Date  . Medical history non-contributory   . Vaginal Pap smear, abnormal     PAST SURGICAL HISTORY: Past Surgical History:  Procedure Laterality Date  . CESAREAN SECTION    . LEEP      SOCIAL HISTORY: Social  History   Tobacco Use  . Smoking status: Never Smoker  . Smokeless tobacco: Never Used  Substance Use Topics  . Alcohol use: Yes    Comment: rarely   . Drug use: No    FAMILY HISTORY: Family History  Problem Relation Age of Onset  . Diabetes Maternal Grandmother   . Hyperlipidemia Mother   . Obesity Mother   . Anemia Sister   . Thyroid disease Sister   . Colon cancer Maternal Aunt     ROS: Review of  Systems  Constitutional: Positive for weight loss.  Gastrointestinal: Negative for nausea and vomiting.  Musculoskeletal:       Negative for muscle weakness    PHYSICAL EXAM: Blood pressure 113/74, pulse 87, temperature 98 F (36.7 C), temperature source Oral, height 5\' 8"  (1.727 m), weight 218 lb (98.9 kg), last menstrual period 06/22/2018, SpO2 97 %, unknown if currently breastfeeding. Body mass index is 33.15 kg/m. Physical Exam Vitals signs reviewed.  Constitutional:      Appearance: Normal appearance. She is well-developed. She is obese.  Cardiovascular:     Rate and Rhythm: Normal rate.  Pulmonary:     Effort: Pulmonary effort is normal.  Musculoskeletal: Normal range of motion.  Skin:    General: Skin is warm and dry.  Neurological:     Mental Status: She is alert and oriented to person, place, and time.  Psychiatric:        Mood and Affect: Mood normal.        Behavior: Behavior normal.     RECENT LABS AND TESTS: BMET    Component Value Date/Time   NA 136 06/05/2018 1017   K 4.4 06/05/2018 1017   CL 97 06/05/2018 1017   CO2 24 06/05/2018 1017   GLUCOSE 79 06/05/2018 1017   GLUCOSE 115 (H) 07/15/2010 0555   BUN 13 06/05/2018 1017   CREATININE 0.93 06/05/2018 1017   CALCIUM 9.4 06/05/2018 1017   GFRNONAA 80 06/05/2018 1017   GFRAA 93 06/05/2018 1017   Lab Results  Component Value Date   HGBA1C 5.3 06/05/2018   Lab Results  Component Value Date   INSULIN 1.2 (L) 06/05/2018   CBC    Component Value Date/Time   WBC 10.7 (H) 08/23/2017 0515   RBC 3.75 (L) 08/23/2017 0515   HGB 11.2 (L) 08/23/2017 0515   HCT 33.2 (L) 08/23/2017 0515   PLT 242 08/23/2017 0515   MCV 88.5 08/23/2017 0515   MCH 29.9 08/23/2017 0515   MCHC 33.7 08/23/2017 0515   RDW 15.0 08/23/2017 0515   Iron/TIBC/Ferritin/ %Sat No results found for: IRON, TIBC, FERRITIN, IRONPCTSAT Lipid Panel     Component Value Date/Time   CHOL 129 06/05/2018 1017   TRIG 37 06/05/2018 1017    HDL 63 06/05/2018 1017   CHOLHDL 2.0 06/05/2018 1017   LDLCALC 59 06/05/2018 1017   Hepatic Function Panel     Component Value Date/Time   PROT 7.7 06/05/2018 1017   ALBUMIN 4.5 06/05/2018 1017   AST 15 06/05/2018 1017   ALT 11 06/05/2018 1017   ALKPHOS 85 06/05/2018 1017   BILITOT 0.4 06/05/2018 1017      Component Value Date/Time   TSH 0.400 (L) 06/05/2018 1017    Ref. Range 06/05/2018 10:17  Vitamin D, 25-Hydroxy Latest Ref Range: 30.0 - 100.0 ng/mL 21.1 (L)     OBESITY BEHAVIORAL INTERVENTION VISIT  Today's visit was # 3   Starting weight: 223 lbs Starting date: 06/05/2018 Today's weight :  218 lbs  Today's date: 07/03/2018 Total lbs lost to date: 5   ASK: We discussed the diagnosis of obesity with [name] today and Camdyn agreed to give [place] permission to discuss obesity behavioral modification therapy today.  ASSESS: Rodnisha has the diagnosis of obesity and her BMI today is 33.15 Georgenia is in the action stage of change   ADVISE: Channel was educated on the multiple health risks of obesity as well as the benefit of weight loss to improve her health. She was advised of the need for long term treatment and the importance of lifestyle modifications to improve her current health and to decrease her risk of future health problems.  AGREE: Multiple dietary modification options and treatment options were discussed and  Shealee agreed to follow the recommendations documented in the above note.  ARRANGE: Arrin was educated on the importance of frequent visits to treat obesity as outlined per CMS and USPSTF guidelines and agreed to schedule her next follow up appointment today.  Cristi Loron, am acting as Energy manager for [employer]. Manson Passey, DO  I have reviewed the above documentation for accuracy and completeness, and I agree with the above. -Corinna Capra, DO

## 2018-07-24 ENCOUNTER — Ambulatory Visit (INDEPENDENT_AMBULATORY_CARE_PROVIDER_SITE_OTHER): Payer: Managed Care, Other (non HMO) | Admitting: Bariatrics

## 2018-07-24 ENCOUNTER — Encounter (INDEPENDENT_AMBULATORY_CARE_PROVIDER_SITE_OTHER): Payer: Self-pay

## 2018-08-04 ENCOUNTER — Ambulatory Visit: Payer: Managed Care, Other (non HMO) | Admitting: Internal Medicine

## 2018-08-11 ENCOUNTER — Ambulatory Visit: Payer: Managed Care, Other (non HMO) | Admitting: Internal Medicine

## 2018-08-16 ENCOUNTER — Ambulatory Visit (INDEPENDENT_AMBULATORY_CARE_PROVIDER_SITE_OTHER): Payer: Managed Care, Other (non HMO) | Admitting: Bariatrics

## 2018-08-16 ENCOUNTER — Encounter (INDEPENDENT_AMBULATORY_CARE_PROVIDER_SITE_OTHER): Payer: Self-pay | Admitting: Bariatrics

## 2018-08-16 VITALS — BP 108/75 | HR 83 | Temp 97.9°F | Ht 68.0 in | Wt 212.0 lb

## 2018-08-16 DIAGNOSIS — Z9189 Other specified personal risk factors, not elsewhere classified: Secondary | ICD-10-CM

## 2018-08-16 DIAGNOSIS — Z6832 Body mass index (BMI) 32.0-32.9, adult: Secondary | ICD-10-CM

## 2018-08-16 DIAGNOSIS — E559 Vitamin D deficiency, unspecified: Secondary | ICD-10-CM | POA: Diagnosis not present

## 2018-08-16 DIAGNOSIS — F3289 Other specified depressive episodes: Secondary | ICD-10-CM

## 2018-08-16 DIAGNOSIS — E669 Obesity, unspecified: Secondary | ICD-10-CM | POA: Diagnosis not present

## 2018-08-16 MED ORDER — VITAMIN D (ERGOCALCIFEROL) 1.25 MG (50000 UNIT) PO CAPS: 50000.0000 [IU] | ORAL_CAPSULE | ORAL | 0 refills | Status: DC

## 2018-08-16 MED ORDER — ESCITALOPRAM OXALATE 10 MG PO TABS: 10.0000 mg | ORAL_TABLET | ORAL | 0 refills | Status: DC

## 2018-08-17 NOTE — Progress Notes (Signed)
Office: [phone]  /  Fax: [phone]   HPI:   Chief Complaint: OBESITY Faith Hanson is here to discuss her progress with her obesity treatment plan. She is on the Category 2 plan and is following her eating plan approximately 90 % of the time. She states she is exercising 0 minutes 0 times per week. Faith Hanson is doing well. She is following the plan well.  Her weight is (P) 212 lb (96.2 kg) today and has had a weight loss of 6 pounds over a period of 6 to 7 weeks since her last visit. She has lost 11 lbs since starting treatment with [place].  Vitamin D Deficiency Faith Hanson has a diagnosis of vitamin D deficiency. She is currently taking prescription Vit D and denies nausea, vomiting or muscle weakness.  At risk for osteopenia and osteoporosis Faith Hanson is at higher risk of osteopenia and osteoporosis due to vitamin D deficiency.   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 She is on Mirena IUD. Faith Hanson struggles with emotional eating and using food for comfort to the extent that it is negatively impacting her health. She often snacks when she is not hungry. Faith Hanson sometimes feels she is out of control and then feels guilty that she made poor food choices. She has been working on behavior modification techniques to help reduce her emotional eating and has been somewhat successful. She shows no sign of suicidal or homicidal ideations.  Depression screen PHQ 2/9 06/05/2018  Decreased Interest 1  Down, Depressed, Hopeless 2  PHQ - 2 Score 3  Altered sleeping 0  Tired, decreased energy 3  Change in appetite 1  Feeling bad or failure about yourself  1  Trouble concentrating 1  Moving slowly or fidgety/restless 0  Suicidal thoughts 0  PHQ-9 Score 9  Difficult doing work/chores Not difficult at all    ASSESSMENT AND PLAN:  Vitamin D deficiency - Plan: Vitamin D, Ergocalciferol, (DRISDOL) 1.25 MG (50000 UT) CAPS  capsule  Other depression - with emotional eating - Plan: escitalopram (LEXAPRO) 10 MG tablet  At risk for osteoporosis  Class 1 obesity with serious comorbidity and body mass index (BMI) of 32.0 to 32.9 in adult, unspecified obesity type  PLAN:  Vitamin D Deficiency Faith Hanson was informed that low vitamin D levels contributes to fatigue and are associated with obesity, breast, and colon cancer. Faith Hanson agrees to continue taking prescription Vit D @50 ,000 IU every week #4 and we will refill for 1 month. She will follow up for routine testing of vitamin D, at least 2-3 times per year. She was informed of the risk of over-replacement of vitamin D and agrees to not increase her dose unless she discusses this with [place] first. Faith Hanson agrees to follow up with our clinic in 2 weeks.  At risk for osteopenia and osteoporosis Faith Hanson was given extended (15 minutes) osteoporosis prevention counseling today. Faith Hanson is at risk for osteopenia and osteoporsis due to her vitamin D deficiency. She was encouraged to take her vitamin D and follow her higher calcium diet and increase strengthening exercise to help strengthen her bones and decrease her risk of osteopenia and osteoporosis.  Depression with Emotional Eating Behaviors We discussed behavior modification techniques today to help Faith Hanson deal with her emotional eating and depression. Faith Hanson agrees to continue taking Lexapro 10 mg 1 tablet PO daily #30 and we will refill for 1 month. Faith Hanson agrees to follow up with our clinic in 2 weeks.  Obesity Faith Hanson is currently in the action stage of change. As such, her goal is to continue with weight loss efforts She has agreed to follow the Category 2 plan Faith Hanson has been instructed to work up to a goal of 150 minutes of combined cardio and strengthening exercise per week for weight loss and overall health benefits. We discussed the following Behavioral Modification Strategies today: increasing lean protein  intake, decreasing simple carbohydrates, increasing vegetables, work on meal planning and easy cooking plans, increase H20 intake, and keeping healthy foods in the home   Faith Hanson has agreed to follow up with our clinic in 2 weeks. She was informed of the importance of frequent follow up visits to maximize her success with intensive lifestyle modifications for her multiple health conditions.  ALLERGIES: No Known Allergies  MEDICATIONS: Current Outpatient Medications on File Prior to Visit  Medication Sig Dispense Refill  . levonorgestrel (MIRENA) 20 MCG/24HR IUD 1 each by Intrauterine route once.     No current facility-administered medications on file prior to visit.     PAST MEDICAL HISTORY: Past Medical History:  Diagnosis Date  . Medical history non-contributory   . Vaginal Pap smear, abnormal     PAST SURGICAL HISTORY: Past Surgical History:  Procedure Laterality Date  . CESAREAN SECTION    . LEEP      SOCIAL HISTORY: Social History   Tobacco Use  . Smoking status: Never Smoker  . Smokeless tobacco: Never Used  Substance Use Topics  . Alcohol use: Yes    Comment: rarely   . Drug use: No    FAMILY HISTORY: Family History  Problem Relation Age of Onset  . Diabetes Maternal Grandmother   . Hyperlipidemia Mother   . Obesity Mother   . Anemia Sister   . Thyroid disease Sister   . Colon cancer Maternal Aunt     ROS: Review of Systems  Constitutional: Positive for weight loss.  Gastrointestinal: Negative for nausea and vomiting.  Musculoskeletal:       Negative muscle weakness  Psychiatric/Behavioral: Positive for depression. Negative for suicidal ideas.       + Anxiety    PHYSICAL EXAM: Blood pressure 108/75, pulse 83, temperature 97.9 F (36.6 C), temperature source Oral, height (P) 5\' 8"  (1.727 m), weight (P) 212 lb (96.2 kg), last menstrual period 07/15/2018, SpO2 99 %, unknown if currently breastfeeding. Body mass index is 32.23 kg/m  (pended). Physical Exam Vitals signs reviewed.  Constitutional:      Appearance: Normal appearance. She is obese.  Cardiovascular:     Rate and Rhythm: Normal rate.     Pulses: Normal pulses.  Pulmonary:     Effort: Pulmonary effort is normal.     Breath sounds: Normal breath sounds.  Musculoskeletal: Normal range of motion.  Skin:    General: Skin is warm and dry.  Neurological:     Mental Status: She is alert and oriented to person, place, and time.  Psychiatric:        Mood and Affect: Mood normal.        Behavior: Behavior normal.     RECENT LABS AND TESTS: BMET    Component Value Date/Time   NA 136 06/05/2018 1017   K 4.4 06/05/2018 1017   CL 97 06/05/2018 1017   CO2 24 06/05/2018 1017   GLUCOSE 79 06/05/2018 1017   GLUCOSE 115 (H) 07/15/2010 0555   BUN 13 06/05/2018 1017   CREATININE 0.93 06/05/2018 1017   CALCIUM 9.4 06/05/2018 1017  GFRNONAA 80 06/05/2018 1017   GFRAA 93 06/05/2018 1017   Lab Results  Component Value Date   HGBA1C 5.3 06/05/2018   Lab Results  Component Value Date   INSULIN 1.2 (L) 06/05/2018   CBC    Component Value Date/Time   WBC 10.7 (H) 08/23/2017 0515   RBC 3.75 (L) 08/23/2017 0515   HGB 11.2 (L) 08/23/2017 0515   HCT 33.2 (L) 08/23/2017 0515   PLT 242 08/23/2017 0515   MCV 88.5 08/23/2017 0515   MCH 29.9 08/23/2017 0515   MCHC 33.7 08/23/2017 0515   RDW 15.0 08/23/2017 0515   Iron/TIBC/Ferritin/ %Sat No results found for: IRON, TIBC, FERRITIN, IRONPCTSAT Lipid Panel     Component Value Date/Time   CHOL 129 06/05/2018 1017   TRIG 37 06/05/2018 1017   HDL 63 06/05/2018 1017   CHOLHDL 2.0 06/05/2018 1017   LDLCALC 59 06/05/2018 1017   Hepatic Function Panel     Component Value Date/Time   PROT 7.7 06/05/2018 1017   ALBUMIN 4.5 06/05/2018 1017   AST 15 06/05/2018 1017   ALT 11 06/05/2018 1017   ALKPHOS 85 06/05/2018 1017   BILITOT 0.4 06/05/2018 1017      Component Value Date/Time   TSH 0.400 (L) 06/05/2018  1017      OBESITY BEHAVIORAL INTERVENTION VISIT  Today's visit was # 4   Starting weight: 223 lbs Starting date: 06/05/18 Today's weight : 212 lbs Today's date: 08/16/2018 Total lbs lost to date: 11    ASK: We discussed the diagnosis of obesity with Faith Hanson today and Faith Hanson agreed to give us permission to discuss obesity behavioral modification therapy today.  ASSESS: Faith Hanson has the diagnosis of obesity and her BMI today is 32.24 Tinslee is in the action stage of change   ADVISE: Faith Hanson was educated on the multiple health risks of obesity as well as the benefit of weight loss to improve her health. She was advised of the need for long term treatment and the importance of lifestyle modifications to improve her current health and to decrease her risk of future health problems.  AGREE: Multiple dietary modification options and treatment options were discussed and  Kamariya agreed to follow the recommendations documented in the above note.  ARRANGE: Karaline was educated on the importance of frequent visits to treat obesity as outlined per CMS and USPSTF guidelines and agreed to schedule her next follow up appointment today.  Trude McburneyI, Sharon Martin, am acting as transcriptionist for [employer] Haydn Cush, DO  I have reviewed the above documentation for accuracy and completeness, and I agree with the above. -Corinna CapraAngel Jonmarc Bodkin, DO

## 2018-08-28 ENCOUNTER — Ambulatory Visit: Payer: Managed Care, Other (non HMO) | Admitting: Internal Medicine

## 2018-08-28 NOTE — Progress Notes (Deleted)
Name: Faith Hanson  MRN/ DOB: [id number], 08/28/1985    Age/ Sex: 33 y.o., female    PCP: Patient, No Pcp Per   Reason for Endocrinology Evaluation: Hyperthyroidism     Date of Initial Endocrinology Evaluation: 08/28/2018     HPI: Faith Hanson is a 33 y.o. female with a past medical history of Obesity . The patient presented for initial endocrinology clinic visit on 08/28/2018 for consultative assistance with her hyperthyroidism   [facility] Obesity evaluation , sh was c/o fatigue, daytime somnolence and dyspnea on exertion.  During this evaluation she was found to have a low TSH at 0.4 uIU/mL with elevated T3 and T4    Sister with thyroid disease.   HISTORY:  Past Medical History:  Past Medical History:  Diagnosis Date  . Medical history non-contributory   . Vaginal Pap smear, abnormal     Past Surgical History:  Past Surgical History:  Procedure Laterality Date  . CESAREAN SECTION    . LEEP        Social History:  reports that she has never smoked. She has never used smokeless tobacco. She reports current alcohol use. She reports that she does not use drugs.  Family History: family history includes Anemia in her sister; Colon cancer in her maternal aunt; Diabetes in her maternal grandmother; Hyperlipidemia in her mother; Obesity in her mother; Thyroid disease in her sister.   HOME MEDICATIONS: Allergies as of 08/28/2018   No Known Allergies     Medication List       Accurate as of August 28, 2018  7:29 AM. Always use your most recent med list.        escitalopram 10 MG tablet Commonly known as:  LEXAPRO Take 1 tablet (10 mg total) by mouth every morning.   levonorgestrel 20 MCG/24HR IUD Commonly known as:  MIRENA 1 each by Intrauterine route once.   Vitamin D (Ergocalciferol) 1.25 MG (50000 UT) Caps capsule Commonly known as:  DRISDOL Take 1 capsule (50,000 Units total) by mouth every 7 (seven)  days.         REVIEW OF SYSTEMS: A comprehensive ROS was conducted with the patient and is negative except as per HPI and below:  ROS     OBJECTIVE:  VS: There were no vitals taken for this visit.   Wt Readings from Last 3 Encounters:  08/16/18 212 lb (96.2 kg)  07/03/18 218 lb (98.9 kg)  06/19/18 220 lb (99.8 kg)     EXAM: General: Pt appears well and is in NAD  Hydration: Well-hydrated with moist mucous membranes and good skin turgor  Eyes: External eye exam normal without stare, lid lag or exophthalmos.  EOM intact.  PERRL.  Ears, Nose, Throat: Hearing: Grossly intact bilaterally Dental: Good dentition  Throat: Clear without mass, erythema or exudate  Neck: General: Supple without adenopathy. Thyroid: Thyroid size normal.  No goiter or nodules appreciated. No thyroid bruit.  Lungs: Clear with good BS bilat with no rales, rhonchi, or wheezes  Heart: Auscultation: RRR.  Abdomen: Normoactive bowel sounds, soft, nontender, without masses or organomegaly palpable  Extremities:  BL LE: No pretibial edema normal ROM and strength.  Skin: Hair: Texture and amount normal with gender appropriate distribution Skin Inspection: No rashes. Skin Palpation: Skin temperature, texture, and thickness normal to palpation  Neuro: Cranial nerves: II - XII grossly intact  Motor: Normal strength throughout DTRs: 2+ and symmetric in  UE without delay in relaxation phase  Mental Status: Judgment, insight: Intact Orientation: Oriented to time, place, and person Memory: Intact for recent and remote events Mood and affect: No depression, anxiety, or agitation     DATA REVIEWED: Results for ZARRAH, TESKA (MRN [id number]) as of 08/28/2018 07:29  Ref. Range 06/05/2018 10:17  TSH Latest Ref Range: 0.450 - 4.500 uIU/mL 0.400 (L)  Triiodothyronine (T3) Latest Ref Range: 71 - 180 ng/dL 409 (H)  W1,XBJY(NWGNFA) Latest Ref Range: 0.82 - 1.77 ng/dL 2.13 (H)       ASSESSMENT/PLAN/RECOMMENDATIONS:   1. Hyperthyroidism :    Medications :  Signed electronically by: Lyndle Herrlich, MD  [facility]  [facility] [address] [place], [place] [address] Phone: [phone] FAX: [phone]   CC: Patient, No Pcp Per No address on file Phone: None Fax: None   Return to Endocrinology clinic as below: Future Appointments  Date Time Provider Department Center  08/28/2018  7:30 AM Shamleffer, Konrad Dolores, MD [facility] None  09/07/2018  4:40 PM Roswell Nickel, DO [facility] None

## 2018-09-06 ENCOUNTER — Ambulatory Visit (INDEPENDENT_AMBULATORY_CARE_PROVIDER_SITE_OTHER): Payer: Managed Care, Other (non HMO) | Admitting: Internal Medicine

## 2018-09-06 ENCOUNTER — Encounter: Payer: Self-pay | Admitting: Internal Medicine

## 2018-09-06 VITALS — BP 100/62 | HR 90 | Resp 16 | Ht 67.0 in | Wt 214.8 lb

## 2018-09-06 DIAGNOSIS — E0590 Thyrotoxicosis, unspecified without thyrotoxic crisis or storm: Secondary | ICD-10-CM | POA: Diagnosis not present

## 2018-09-06 LAB — CBC WITH DIFFERENTIAL/PLATELET
BASOS ABS: 0.1 10*3/uL (ref 0.0–0.1)
Basophils Relative: 1.2 % (ref 0.0–3.0)
Eosinophils Absolute: 0.1 10*3/uL (ref 0.0–0.7)
Eosinophils Relative: 1.6 % (ref 0.0–5.0)
HEMATOCRIT: 42.7 % (ref 36.0–46.0)
HEMOGLOBIN: 14.3 g/dL (ref 12.0–15.0)
LYMPHS PCT: 27.5 % (ref 12.0–46.0)
Lymphs Abs: 1.5 10*3/uL (ref 0.7–4.0)
MCHC: 33.4 g/dL (ref 30.0–36.0)
MCV: 92.6 fl (ref 78.0–100.0)
MONOS PCT: 7.2 % (ref 3.0–12.0)
Monocytes Absolute: 0.4 10*3/uL (ref 0.1–1.0)
Neutro Abs: 3.4 10*3/uL (ref 1.4–7.7)
Neutrophils Relative %: 62.5 % (ref 43.0–77.0)
Platelets: 289 10*3/uL (ref 150.0–400.0)
RBC: 4.61 Mil/uL (ref 3.87–5.11)
RDW: 15.3 % (ref 11.5–15.5)
WBC: 5.4 10*3/uL (ref 4.0–10.5)

## 2018-09-06 LAB — COMPREHENSIVE METABOLIC PANEL
ALBUMIN: 4.4 g/dL (ref 3.5–5.2)
ALK PHOS: 63 U/L (ref 39–117)
ALT: 11 U/L (ref 0–35)
AST: 13 U/L (ref 0–37)
BILIRUBIN TOTAL: 0.5 mg/dL (ref 0.2–1.2)
BUN: 18 mg/dL (ref 6–23)
CALCIUM: 9.5 mg/dL (ref 8.4–10.5)
CO2: 28 meq/L (ref 19–32)
Chloride: 101 mEq/L (ref 96–112)
Creatinine, Ser: 0.93 mg/dL (ref 0.40–1.20)
GFR: 83.16 mL/min (ref >60.00)
Glucose, Bld: 83 mg/dL (ref 70–99)
Potassium: 3.9 mEq/L (ref 3.5–5.1)
Sodium: 137 mEq/L (ref 135–145)
TOTAL PROTEIN: 7.8 g/dL (ref 6.0–8.3)

## 2018-09-06 LAB — TSH: TSH: 1.44 u[IU]/mL (ref 0.35–4.50)

## 2018-09-06 LAB — T4, FREE: FREE T4: 1.02 ng/dL (ref 0.60–1.60)

## 2018-09-06 NOTE — Progress Notes (Signed)
Name: Faith Hanson  MRN/ DOB: [id number], [birth year]    Age/ Sex: 33 y.o., female    PCP: Patient, No Pcp Per   Reason for Endocrinology Evaluation: Hyperthyroidism     Date of Initial Endocrinology Evaluation: 09/06/2018     HPI: [name] is a 33 y.o. female with a past medical history of Obesity . The patient presented for initial endocrinology clinic visit on 09/06/2018 for consultative assistance with her hyperthyroidism   Pt was noted to have abnormal TFT on 06/05/18  On lab work. Her TSH was 0.400 uIU/mL with an elevated FT4 3.31 ng/dL .  She is status delivery of a baby boy in 08/2017.  She was started on Phentermine by her ObGyn in 02/2018 for weight loss, that she took for 2 month, stopped due to lack of effective.   Then she presented to the weight and wellness clinic in November and found the above abnormal TFT's  She has lost weight since starting follow up at the wellness clinic, she does have fatigue.  Denies palpitation or diarrhea.   She does not take biotin.  No OTC weight loss meds.   She denies local neck symptoms. She does have occasional swelling at the lymph node level.   Sister with thyroid disease.   HISTORY:  Past Medical History:  Past Medical History:  Diagnosis Date  . Medical history non-contributory   . Vaginal Pap smear, abnormal    Past Surgical History:  Past Surgical History:  Procedure Laterality Date  . CESAREAN SECTION    . LEEP        Social History:  reports that she has never smoked. She has never used smokeless tobacco. She reports current alcohol use. She reports that she does not use drugs.  Family History: family history includes Anemia in her sister; Colon cancer in her maternal aunt; Diabetes in her maternal grandmother; Hyperlipidemia in her mother; Obesity in her mother; Thyroid disease in her sister.   HOME MEDICATIONS: Allergies as of 09/06/2018   No Known Allergies     Medication List       Accurate as of September 06, 2018  7:51 AM. Always use your most recent med list.        escitalopram 10 MG tablet Commonly known as:  LEXAPRO Take 1 tablet (10 mg total) by mouth every morning.   levonorgestrel 20 MCG/24HR IUD Commonly known as:  MIRENA 1 each by Intrauterine route once.   Vitamin D (Ergocalciferol) 1.25 MG (50000 UT) Caps capsule Commonly known as:  DRISDOL Take 1 capsule (50,000 Units total) by mouth every 7 (seven) days.         REVIEW OF SYSTEMS: A comprehensive ROS was conducted with the patient and is negative except as per HPI and below:  Review of Systems  Constitutional: Positive for malaise/fatigue. Negative for weight loss.  HENT: Positive for congestion and sore throat.   Eyes: Negative for pain and discharge.  Respiratory: Negative for cough and shortness of breath.   Cardiovascular: Negative for chest pain and palpitations.  Gastrointestinal: Negative for diarrhea and nausea.  Genitourinary: Negative for frequency.  Neurological: Negative for tingling and tremors.  Endo/Heme/Allergies: Negative for polydipsia.  Psychiatric/Behavioral: Negative for depression. The patient is nervous/anxious.        OBJECTIVE:  VS: BP 100/62 (BP Location: Right Arm, Patient Position: Sitting, Cuff Size: Large)   Pulse 90   Resp 16   Ht 5\' 7"  (1.702 m)  Wt 214 lb 12.8 oz (97.4 kg)   SpO2 98%   BMI 33.64 kg/m    Wt Readings from Last 3 Encounters:  09/06/18 214 lb 12.8 oz (97.4 kg)  08/16/18 212 lb (96.2 kg)  07/03/18 218 lb (98.9 kg)     EXAM: General: Pt appears well and is in NAD  Hydration: Well-hydrated with moist mucous membranes and good skin turgor  Eyes: External eye exam normal without stare, lid lag or exophthalmos.  EOM intact.  PERRL.  Ears, Nose, Throat: Hearing: Grossly intact bilaterally Dental: Good dentition  Throat: Clear without mass, erythema or exudate  Neck: General: Supple without adenopathy. Thyroid: Thyroid size  normal.  No goiter or nodules appreciated. No thyroid bruit.  Lungs: Clear with good BS bilat with no rales, rhonchi, or wheezes  Heart: Auscultation: RRR.  Abdomen: Normoactive bowel sounds, soft, nontender, without masses or organomegaly palpable  Extremities:  BL LE: No pretibial edema normal ROM and strength.  Skin: Hair: Texture and amount normal with gender appropriate distribution Skin Inspection: No rashes. Skin Palpation: Skin temperature, texture, and thickness normal to palpation  Neuro: Cranial nerves: II - XII grossly intact  Motor: Normal strength throughout DTRs: 2+ and symmetric in UE without delay in relaxation phase  Mental Status: Judgment, insight: Intact Orientation: Oriented to time, place, and person Mood and affect: No depression, anxiety, or agitation     DATA REVIEWED:  Results for CHEMISE, AMARO (MRN [id number]) as of 09/06/2018 15:26  Ref. Range 06/05/2018 10:17 09/06/2018 08:16  TSH Latest Ref Range: 0.35 - 4.50 uIU/mL 0.400 (L) 1.44  Triiodothyronine (T3) Latest Ref Range: 71 - 180 ng/dL 071 (H)   Q1,FXJO(ITGPQD) Latest Ref Range: 0.60 - 1.60 ng/dL 8.26 (H) 4.15     ASSESSMENT/PLAN/RECOMMENDATIONS:   1. Hyperthyroidism   - She is clinically euthyroid  - She has no local neck symptoms except for mild scratchy throat - We discussed D/D of Graves' disease vs painless thyroiditis.  - TFT's today show normal TSH and Free T4, this is most likely consistent with painless thyroiditis.I am still awaiting on FT3 and TRAb levels which most likely are going to be normal     Medications : n/a   F/u in 6 weeks     Signed electronically by: Lyndle Herrlich, MD  [facility]  [facility] [address] [place] [place], [place] [address] Phone: [phone] FAX: [phone]   CC: Patient, No Pcp Per No address on file Phone: None Fax: None   Return to Endocrinology clinic as below: Future Appointments    Date Time Provider Department Center  09/07/2018  4:40 PM Roswell Nickel, DO [facility] None

## 2018-09-06 NOTE — Patient Instructions (Signed)
-   We will check your labs today, we will contact you with the results.

## 2018-09-07 ENCOUNTER — Encounter (INDEPENDENT_AMBULATORY_CARE_PROVIDER_SITE_OTHER): Payer: Self-pay

## 2018-09-07 ENCOUNTER — Ambulatory Visit (INDEPENDENT_AMBULATORY_CARE_PROVIDER_SITE_OTHER): Payer: Managed Care, Other (non HMO) | Admitting: Bariatrics

## 2018-09-08 LAB — TRAB (TSH RECEPTOR BINDING ANTIBODY): TRAB: <1 IU/L (ref <=2.00)

## 2018-09-08 LAB — T3: T3, Total: 120 ng/dL (ref 76–181)

## 2018-09-14 ENCOUNTER — Ambulatory Visit (INDEPENDENT_AMBULATORY_CARE_PROVIDER_SITE_OTHER): Payer: Managed Care, Other (non HMO) | Admitting: Family Medicine

## 2018-09-22 ENCOUNTER — Other Ambulatory Visit: Payer: Self-pay | Admitting: Internal Medicine

## 2018-09-22 DIAGNOSIS — E0590 Thyrotoxicosis, unspecified without thyrotoxic crisis or storm: Secondary | ICD-10-CM

## 2018-09-25 ENCOUNTER — Ambulatory Visit (INDEPENDENT_AMBULATORY_CARE_PROVIDER_SITE_OTHER): Payer: Managed Care, Other (non HMO) | Admitting: Family Medicine

## 2018-09-25 ENCOUNTER — Telehealth (INDEPENDENT_AMBULATORY_CARE_PROVIDER_SITE_OTHER): Payer: Self-pay

## 2018-09-25 ENCOUNTER — Encounter (INDEPENDENT_AMBULATORY_CARE_PROVIDER_SITE_OTHER): Payer: Self-pay | Admitting: Family Medicine

## 2018-09-25 VITALS — BP 106/69 | HR 71 | Temp 97.7°F | Ht 68.0 in | Wt 212.0 lb

## 2018-09-25 DIAGNOSIS — E559 Vitamin D deficiency, unspecified: Secondary | ICD-10-CM

## 2018-09-25 DIAGNOSIS — E669 Obesity, unspecified: Secondary | ICD-10-CM | POA: Diagnosis not present

## 2018-09-25 DIAGNOSIS — Z9189 Other specified personal risk factors, not elsewhere classified: Secondary | ICD-10-CM | POA: Diagnosis not present

## 2018-09-25 DIAGNOSIS — F3289 Other specified depressive episodes: Secondary | ICD-10-CM

## 2018-09-25 DIAGNOSIS — Z6832 Body mass index (BMI) 32.0-32.9, adult: Secondary | ICD-10-CM

## 2018-09-25 NOTE — Telephone Encounter (Signed)
LM to see if patient is still breast feeding per [facility].

## 2018-09-25 NOTE — Telephone Encounter (Signed)
-----   Message from Jesse Sans, FNP sent at 09/25/2018  1:07 PM EDT ----- Regarding: Breast Feeding? Hi, [place], Can you please call this patient and see is she is still breast feeding? I know that she has a 35 year old. Thanks, [employer]

## 2018-09-26 ENCOUNTER — Encounter (INDEPENDENT_AMBULATORY_CARE_PROVIDER_SITE_OTHER): Payer: Self-pay | Admitting: Family Medicine

## 2018-09-26 ENCOUNTER — Other Ambulatory Visit (INDEPENDENT_AMBULATORY_CARE_PROVIDER_SITE_OTHER): Payer: Self-pay | Admitting: Family Medicine

## 2018-09-26 ENCOUNTER — Telehealth (INDEPENDENT_AMBULATORY_CARE_PROVIDER_SITE_OTHER): Payer: Self-pay | Admitting: Family Medicine

## 2018-09-26 DIAGNOSIS — F329 Major depressive disorder, single episode, unspecified: Secondary | ICD-10-CM | POA: Insufficient documentation

## 2018-09-26 DIAGNOSIS — E559 Vitamin D deficiency, unspecified: Secondary | ICD-10-CM

## 2018-09-26 DIAGNOSIS — F419 Anxiety disorder, unspecified: Secondary | ICD-10-CM | POA: Insufficient documentation

## 2018-09-26 DIAGNOSIS — F32A Depression, unspecified: Secondary | ICD-10-CM | POA: Insufficient documentation

## 2018-09-26 MED ORDER — VITAMIN D (ERGOCALCIFEROL) 1.25 MG (50000 UNIT) PO CAPS: 50000.0000 [IU] | ORAL_CAPSULE | ORAL | 0 refills | Status: DC

## 2018-09-26 NOTE — Progress Notes (Signed)
Office: [phone]  /  Fax: [phone]   HPI:   Chief Complaint: OBESITY Faith Hanson is here to discuss her progress with her obesity treatment plan. She is on the Category 2 plan and is following her eating plan approximately 90% of the time. She states she is exercising 0 minutes 0 times per week. Shere states she has not been prepping dinner and instead has been picking up dinner. Her weight is 212 lb (96.2 kg) today and has not lost weight since her last visit. She has lost 11 lbs since starting treatment with [place].  Vitamin D deficiency Faith Hanson has a diagnosis of Vitamin D deficiency and is not at goal. She is currently taking prescription Vit D and denies nausea, vomiting or muscle weakness.  At risk for osteopenia and osteoporosis Faith Hanson is at higher risk of osteopenia and osteoporosis due to Vitamin D deficiency.   Depression with emotional eating behaviors Faith Hanson is struggling with emotional eating and using food for comfort to the extent that it is negatively impacting her health. She often snacks when she is not hungry. Faith Hanson sometimes feels she is out of control and then feels guilty that she made poor food choices. She has been working on behavior modification techniques to help reduce her emotional eating and has been somewhat successful. Faith Hanson states she feels Faith Hanson is working fairly well but she would like something that gives her more energy. She shows no sign of suicidal or homicidal ideations.  Depression screen PHQ 2/9 06/05/2018  Decreased Interest 1  Down, Depressed, Hopeless 2  PHQ - 2 Score 3  Altered sleeping 0  Tired, decreased energy 3  Change in appetite 1  Feeling bad or failure about yourself  1  Trouble concentrating 1  Moving slowly or fidgety/restless 0  Suicidal thoughts 0  PHQ-9 Score 9  Difficult doing work/chores Not difficult at all   ASSESSMENT AND PLAN:  Vitamin D deficiency - Plan: Vitamin D, Ergocalciferol, (DRISDOL) 1.25 MG  (50000 UT) CAPS capsule  Other depression - with emotional eating  At risk for osteoporosis  Class 1 obesity with serious comorbidity and body mass index (BMI) of 32.0 to 32.9 in adult, unspecified obesity type  PLAN:  Vitamin D Deficiency Faith Hanson was informed that low Vitamin D levels contributes to fatigue and are associated with obesity, breast, and colon cancer. She agrees to continue to take prescription Vit D @ 50,000 IU every week #4 with 0 refills and will follow-up for routine testing of Vitamin D, at least 2-3 times per year. She was informed of the risk of over-replacement of Vitamin D and agrees to not increase her dose unless she discusses this with [place] first. Faith Hanson agrees to follow-up with our clinic in 3 weeks.  At risk for osteopenia and osteoporosis Faith Hanson was given extended  (15 minutes) osteoporosis prevention counseling today. Faith Hanson is at risk for osteopenia and osteoporsis due to her Vitamin D deficiency. She was encouraged to take her Vitamin D and follow her higher calcium diet and increase strengthening exercise to help strengthen her bones and decrease her risk of osteopenia and osteoporosis.  Depression with Emotional Eating Behaviors We discussed behavior modification techniques today to help Faith Hanson deal with her emotional eating and depression. She will continue taking Faith Hanson and I advised her we will discuss other options at her next visit.  Obesity Faith Hanson is currently in the action stage of change. As such, her goal is to continue with weight loss efforts. She has agreed to  keep a food journal with 1100-1200 calories and 85 grams of protein daily. We discussed the following Behavioral Modification Strategies today: work on meal planning and easy cooking plans and planning for success. Faith Hanson has not been prescribed exercise at this time. Faith Hanson has agreed to follow-up with our clinic in 3 weeks. She was informed of the importance of frequent follow up  visits to maximize her success with intensive lifestyle modifications for her multiple health conditions.  ALLERGIES: No Known Allergies  MEDICATIONS: Current Outpatient Medications on File Prior to Visit  Medication Sig Dispense Refill  . escitalopram (Faith Hanson) 10 MG tablet Take 1 tablet (10 mg total) by mouth every morning. 30 tablet 0  . levonorgestrel (MIRENA) 20 MCG/24HR IUD 1 each by Intrauterine route once.    . Vitamin D, Ergocalciferol, (DRISDOL) 1.25 MG (50000 UT) CAPS capsule Take 1 capsule (50,000 Units total) by mouth every 7 (seven) days. 4 capsule 0   No current facility-administered medications on file prior to visit.     PAST MEDICAL HISTORY: Past Medical History:  Diagnosis Date  . Medical history non-contributory   . Vaginal Pap smear, abnormal     PAST SURGICAL HISTORY: Past Surgical History:  Procedure Laterality Date  . CESAREAN SECTION    . LEEP      SOCIAL HISTORY: Social History   Tobacco Use  . Smoking status: Never Smoker  . Smokeless tobacco: Never Used  Substance Use Topics  . Alcohol use: Yes    Comment: rarely   . Drug use: No    FAMILY HISTORY: Family History  Problem Relation Age of Onset  . Diabetes Maternal Grandmother   . Hyperlipidemia Mother   . Obesity Mother   . Anemia Sister   . Thyroid disease Sister   . Colon cancer Maternal Aunt    ROS: Review of Systems  Constitutional: Negative for weight loss.  Gastrointestinal: Negative for nausea and vomiting.  Musculoskeletal:       Negative for muscle weakness.  Psychiatric/Behavioral: Positive for depression (emotional eating).   PHYSICAL EXAM: Blood pressure 106/69, pulse 71, temperature 97.7 F (36.5 C), height 5\' 8"  (1.727 m), weight 212 lb (96.2 kg), SpO2 100 %, unknown if currently breastfeeding. Body mass index is 32.23 kg/m. Physical Exam Vitals signs reviewed.  Constitutional:      Appearance: Normal appearance. She is obese.  Cardiovascular:     Rate and  Rhythm: Normal rate.     Pulses: Normal pulses.  Pulmonary:     Effort: Pulmonary effort is normal.     Breath sounds: Normal breath sounds.  Musculoskeletal: Normal range of motion.  Skin:    General: Skin is warm and dry.  Neurological:     Mental Status: She is alert and oriented to person, place, and time.  Psychiatric:        Behavior: Behavior normal.   RECENT LABS AND TESTS: BMET    Component Value Date/Time   NA 137 09/06/2018 0816   NA 136 06/05/2018 1017   K 3.9 09/06/2018 0816   CL 101 09/06/2018 0816   CO2 28 09/06/2018 0816   GLUCOSE 83 09/06/2018 0816   BUN 18 09/06/2018 0816   BUN 13 06/05/2018 1017   CREATININE 0.93 09/06/2018 0816   CALCIUM 9.5 09/06/2018 0816   GFRNONAA 80 06/05/2018 1017   GFRAA 93 06/05/2018 1017   Lab Results  Component Value Date   HGBA1C 5.3 06/05/2018   Lab Results  Component Value Date   INSULIN 1.2 (  L) 06/05/2018   CBC    Component Value Date/Time   WBC 5.4 09/06/2018 0816   RBC 4.61 09/06/2018 0816   HGB 14.3 09/06/2018 0816   HCT 42.7 09/06/2018 0816   PLT 289.0 09/06/2018 0816   MCV 92.6 09/06/2018 0816   MCH 29.9 08/23/2017 0515   MCHC 33.4 09/06/2018 0816   RDW 15.3 09/06/2018 0816   LYMPHSABS 1.5 09/06/2018 0816   MONOABS 0.4 09/06/2018 0816   EOSABS 0.1 09/06/2018 0816   BASOSABS 0.1 09/06/2018 0816   Iron/TIBC/Ferritin/ %Sat No results found for: IRON, TIBC, FERRITIN, IRONPCTSAT Lipid Panel     Component Value Date/Time   CHOL 129 06/05/2018 1017   TRIG 37 06/05/2018 1017   HDL 63 06/05/2018 1017   CHOLHDL 2.0 06/05/2018 1017   LDLCALC 59 06/05/2018 1017   Hepatic Function Panel     Component Value Date/Time   PROT 7.8 09/06/2018 0816   PROT 7.7 06/05/2018 1017   ALBUMIN 4.4 09/06/2018 0816   ALBUMIN 4.5 06/05/2018 1017   AST 13 09/06/2018 0816   ALT 11 09/06/2018 0816   ALKPHOS 63 09/06/2018 0816   BILITOT 0.5 09/06/2018 0816   BILITOT 0.4 06/05/2018 1017      Component Value Date/Time     TSH 1.44 09/06/2018 0816   TSH 0.400 (L) 06/05/2018 1017    Ref. Range 06/05/2018 10:17  Vitamin D, 25-Hydroxy Latest Ref Range: 30.0 - 100.0 ng/mL 21.1 (L)   OBESITY BEHAVIORAL INTERVENTION VISIT  Today's visit was #5  Starting weight: 223 lbs Starting date: 06/05/2018 Today's weight: 212 lbs Today's date: 09/25/2018 Total lbs lost to date: 11    09/25/2018  Height  (1.727 m)  Weight 212 lb (96.2 kg)  BMI (Calculated) 32.24  BLOOD PRESSURE - SYSTOLIC 106  BLOOD PRESSURE - DIASTOLIC 69   Body Fat % 38.7 %  Total Body Water (lbs) 83.8 lbs   ASK: We discussed the diagnosis of obesity with [name] today and Shauntel agreed to give [place] permission to discuss obesity behavioral modification therapy today.  ASSESS: Kiran has the diagnosis of obesity and her BMI today is 32.24. Jodelle is in the action stage of change.   ADVISE: Dejai was educated on the multiple health risks of obesity as well as the benefit of weight loss to improve her health. She was advised of the need for long term treatment and the importance of lifestyle modifications to improve her current health and to decrease her risk of future health problems.  AGREE: Multiple dietary modification options and treatment options were discussed and  Shakila agreed to follow the recommendations documented in the above note.  ARRANGE: Naelani was educated on the importance of frequent visits to treat obesity as outlined per CMS and USPSTF guidelines and agreed to schedule her next follow up appointment today.  IMarianna Payment, am acting as Energy manager for [place], FNP-C.  I have reviewed the above documentation for accuracy and completeness, and I agree with the above.  - Sundy Houchins, FNP-C.

## 2018-09-26 NOTE — Telephone Encounter (Signed)
LMTCB to let us know if she is breast feeding.

## 2018-09-26 NOTE — Telephone Encounter (Signed)
LMTCB for Vitamin D refill.

## 2018-09-26 NOTE — Telephone Encounter (Signed)
Patient returning call to [facility].  She states she would like a refill of her Vit D.  Please send request to [facility]. Thank you.

## 2018-09-28 ENCOUNTER — Other Ambulatory Visit: Payer: Managed Care, Other (non HMO)

## 2018-10-10 ENCOUNTER — Encounter (INDEPENDENT_AMBULATORY_CARE_PROVIDER_SITE_OTHER): Payer: Self-pay

## 2018-10-12 ENCOUNTER — Other Ambulatory Visit: Payer: Self-pay

## 2018-10-13 ENCOUNTER — Encounter: Payer: Self-pay | Admitting: Internal Medicine

## 2018-10-13 ENCOUNTER — Ambulatory Visit (INDEPENDENT_AMBULATORY_CARE_PROVIDER_SITE_OTHER): Payer: Managed Care, Other (non HMO) | Admitting: Internal Medicine

## 2018-10-13 DIAGNOSIS — E069 Thyroiditis, unspecified: Secondary | ICD-10-CM | POA: Insufficient documentation

## 2018-10-13 DIAGNOSIS — E0590 Thyrotoxicosis, unspecified without thyrotoxic crisis or storm: Secondary | ICD-10-CM | POA: Insufficient documentation

## 2018-10-13 NOTE — Progress Notes (Signed)
Virtual Visit via Video Note  I connected with Faith Hanson on 10/13/2018 at 1540  by a video enabled telemedicine application and verified that I am speaking with the correct person using two identifiers.   I discussed the limitations of evaluation and management by telemedicine and the availability of in person appointments. The patient expressed understanding and agreed to proceed.  -Location of the patient : home  -Location of the provider : Office  -The names of all persons participating in the telemedicine service : CMA Lelon Frohlich    Name: Faith Hanson  MRN/ DOB: [id number], [birth year]      Age/ Sex: 33 y.o., female     PCP: Patient, No Pcp Per   Reason for Endocrinology Evaluation: Hyperthyroidism     Initial Endocrinology Clinic Visit: 09/06/2018    PATIENT IDENTIFIER: Faith Hanson is a 33 y.o., female with a past medical history of obesity . She has followed with [facility] since 09/06/2018 for consultative assistance with management of her hyperthyroidism.   HISTORICAL SUMMARY: The patient was first diagnosed with hyperthyroidism through the wellness clinic when she was noted to have abnormal TFT's on routine work up with a TSH of 0.400 uIU/mL with high FT4 3.31 ng/dL and elevated T3 at 846 ng/dL.   She is s/p delivery of a baby boy 08/2017  SUBJECTIVE:   During last visit (09/06/18): Clinically and bio-chemicaly she was euthyroid. No intervention provided.   Today (10/13/2018):  Faith Hanson is here for 6 week on hyperthyroidism . Today she feels well, she denies any local neck symptoms. Weight has been stable. Denies heat intolerance, diarrhea or palpitations.   ROS:  As per HPI.   HISTORY:  Past Medical History:  Past Medical History:  Diagnosis Date  . Medical history non-contributory   . Vaginal Pap smear, abnormal    Past Surgical History:  Past Surgical History:  Procedure Laterality Date  . CESAREAN SECTION     . LEEP      Social History:  reports that she has never smoked. She has never used smokeless tobacco. She reports current alcohol use. She reports that she does not use drugs. Family History:  Family History  Problem Relation Age of Onset  . Diabetes Maternal Grandmother   . Hyperlipidemia Mother   . Obesity Mother   . Anemia Sister   . Thyroid disease Sister   . Colon cancer Maternal Aunt      HOME MEDICATIONS: Allergies as of 10/13/2018   No Known Allergies     Medication List       Accurate as of October 13, 2018  4:24 PM. Always use your most recent med list.        escitalopram 10 MG tablet Commonly known as:  Lexapro Take 1 tablet (10 mg total) by mouth every morning.   levonorgestrel 20 MCG/24HR IUD Commonly known as:  MIRENA 1 each by Intrauterine route once.   Vitamin D (Ergocalciferol) 1.25 MG (50000 UT) Caps capsule Commonly known as:  DRISDOL Take 1 capsule (50,000 Units total) by mouth every 7 (seven) days.       DATA REVIEWED:  Results for OLINKA, ALLING (MRN [id number]) as of 10/13/2018 16:21  Ref. Range 09/06/2018 08:16  TRAB Latest Ref Range: <=2.00 IU/L <1.00     ASSESSMENT / PLAN / RECOMMENDATIONS:   1. Hyperthyroidism :   - Based on her last thyroid function testing, this has resolved.  - This  is most consistent with some form of thyroiditis.  - Clinically she is euthyroid  - I have advised her to stop by the lab in the next few weeks to confirm continued normalization of her TFT's.  - TRAb negative, which excludes graves' disease.    F/U PRN    Signed electronically by: Lyndle Herrlich, MD  [facility]  [facility] [address] [place], [place] [address] Phone: [phone] FAX: [phone]      CC: Patient, No Pcp Per No address on file Phone: None  Fax: None   Return to Endocrinology clinic as below: Future Appointments  Date Time Provider Department Center   10/17/2018  7:15 AM Whitmire, Thermon Leyland, FNP [facility] None

## 2018-10-17 ENCOUNTER — Encounter (INDEPENDENT_AMBULATORY_CARE_PROVIDER_SITE_OTHER): Payer: Self-pay | Admitting: Family Medicine

## 2018-10-17 ENCOUNTER — Other Ambulatory Visit: Payer: Self-pay

## 2018-10-17 ENCOUNTER — Ambulatory Visit (INDEPENDENT_AMBULATORY_CARE_PROVIDER_SITE_OTHER): Payer: Managed Care, Other (non HMO) | Admitting: Family Medicine

## 2018-10-17 DIAGNOSIS — E559 Vitamin D deficiency, unspecified: Secondary | ICD-10-CM

## 2018-10-17 DIAGNOSIS — E669 Obesity, unspecified: Secondary | ICD-10-CM | POA: Diagnosis not present

## 2018-10-17 DIAGNOSIS — Z6832 Body mass index (BMI) 32.0-32.9, adult: Secondary | ICD-10-CM

## 2018-10-17 DIAGNOSIS — F3289 Other specified depressive episodes: Secondary | ICD-10-CM | POA: Diagnosis not present

## 2018-10-17 MED ORDER — ESCITALOPRAM OXALATE 10 MG PO TABS: 10.0000 mg | ORAL_TABLET | ORAL | 0 refills | Status: DC

## 2018-10-17 MED ORDER — VITAMIN D (ERGOCALCIFEROL) 1.25 MG (50000 UNIT) PO CAPS: 50000.0000 [IU] | ORAL_CAPSULE | ORAL | 0 refills | Status: DC

## 2018-10-17 NOTE — Progress Notes (Addendum)
Office: [phone]  /  Fax: [phone] TeleHealth Visit:  Faith Hanson has consented to this TeleHealth visit today via FaceTime. The patient is located at home, the provider is located at the [facility] office. The participants in this visit include the listed provider and patient and any and all parties involved.  HPI:   Chief Complaint: OBESITY Faith Hanson is here to discuss her progress with her obesity treatment plan. She is on the keep a food journal with 1100 to 1200 calories and 85 grams of protein daily and is following her eating plan approximately 96 % of the time. She states she is exercising 0 minutes 0 times per week. Faith Hanson weighed 212 lbs a few days ago reflecting no weight loss. She plans to start back to journaling this week. She has not been journaling this past week. She is meeting her protein goals. Faith Hanson denies emotional eating at this time. We were unable to weigh the patient today for this TeleHealth visit.She feels as if she has maintained weight since her last visit. She has lost 11 lbs since starting treatment with [place].  Vitamin D deficiency Faith Hanson has a diagnosis of vitamin D deficiency. Her last vitamin D level was at 21.1 on 06/05/18 and she is not at goal. She is currently taking vit D and denies nausea, vomiting or muscle weakness.  Depression with emotional eating behaviors Faith Hanson struggles with emotional eating and using food for comfort to the extent that it is negatively impacting her health. She often snacks when she is not hungry. Faith Hanson sometimes feels she is out of control and then feels guilty that she made poor food choices. She denies emotional eating at this time. She has been working on behavior modification techniques to help reduce her emotional eating and has been somewhat successful. She shows no sign of suicidal or homicidal ideations.  Depression screen PHQ 2/9 06/05/2018  Decreased Interest 1  Down, Depressed,  Hopeless 2  PHQ - 2 Score 3  Altered sleeping 0  Tired, decreased energy 3  Change in appetite 1  Feeling bad or failure about yourself  1  Trouble concentrating 1  Moving slowly or fidgety/restless 0  Suicidal thoughts 0  PHQ-9 Score 9  Difficult doing work/chores Not difficult at all    ASSESSMENT AND PLAN:  Vitamin D deficiency - Plan: Vitamin D, Ergocalciferol, (DRISDOL) 1.25 MG (50000 UT) CAPS capsule  Other depression - with emotional eating - Plan: escitalopram (LEXAPRO) 10 MG tablet  Class 1 obesity with serious comorbidity and body mass index (BMI) of 32.0 to 32.9 in adult, unspecified obesity type  PLAN:  Vitamin D Deficiency Faith Hanson was informed that low vitamin D levels contributes to fatigue and are associated with obesity, breast, and colon cancer. She agrees to continue to take prescription Vit D @50 ,000 IU every week #4 with no refills and will follow up for routine testing of vitamin D, at least 2-3 times per year. She was informed of the risk of over-replacement of vitamin D and agrees to not increase her dose unless she discusses this with [place] first. We will recheck vitamin D level in 1 to 2 months and Faith Hanson agrees to follow up as directed.  Depression with Emotional Eating Behaviors We discussed behavior modification techniques today to help Faith Hanson deal with her emotional eating and depression. She has agreed continue Lexapro 10 mg qd #30 with no refills and follow up as directed.  Obesity Faith Hanson is currently in the action stage  of change. As such, her goal is to continue with weight loss efforts  She will start journaling again. She has agreed to keep a food journal with 1100 to 1200 calories and 85 grams of protein daily Faith Hanson has been instructed to work up to a goal of 150 minutes of combined cardio and strengthening exercise per week for weight loss and overall health benefits. We discussed the following Behavioral Modification Strategies today: planning  for success and keeping a strict food journal  Faith Hanson has agreed to follow up with our clinic in 2 to 3 weeks. She was informed of the importance of frequent follow up visits to maximize her success with intensive lifestyle modifications for her multiple health conditions.  ALLERGIES: No Known Allergies  MEDICATIONS: Current Outpatient Medications on File Prior to Visit  Medication Sig Dispense Refill   levonorgestrel (MIRENA) 20 MCG/24HR IUD 1 each by Intrauterine route once.     No current facility-administered medications on file prior to visit.     PAST MEDICAL HISTORY: Past Medical History:  Diagnosis Date   Medical history non-contributory    Vaginal Pap smear, abnormal     PAST SURGICAL HISTORY: Past Surgical History:  Procedure Laterality Date   CESAREAN SECTION     LEEP      SOCIAL HISTORY: Social History   Tobacco Use   Smoking status: Never Smoker   Smokeless tobacco: Never Used  Substance Use Topics   Alcohol use: Yes    Comment: rarely    Drug use: No    FAMILY HISTORY: Family History  Problem Relation Age of Onset   Diabetes Maternal Grandmother    Hyperlipidemia Mother    Obesity Mother    Anemia Sister    Thyroid disease Sister    Colon cancer Maternal Aunt     ROS: Review of Systems  Constitutional: Negative for weight loss.  Gastrointestinal: Negative for nausea and vomiting.  Musculoskeletal:       Negative for muscle weakness  Psychiatric/Behavioral: Positive for depression. Negative for suicidal ideas.    PHYSICAL EXAM: Pt in no acute distress  RECENT LABS AND TESTS: BMET    Component Value Date/Time   NA 137 09/06/2018 0816   NA 136 06/05/2018 1017   K 3.9 09/06/2018 0816   CL 101 09/06/2018 0816   CO2 28 09/06/2018 0816   GLUCOSE 83 09/06/2018 0816   BUN 18 09/06/2018 0816   BUN 13 06/05/2018 1017   CREATININE 0.93 09/06/2018 0816   CALCIUM 9.5 09/06/2018 0816   GFRNONAA 80 06/05/2018 1017   GFRAA 93  06/05/2018 1017   Lab Results  Component Value Date   HGBA1C 5.3 06/05/2018   Lab Results  Component Value Date   INSULIN 1.2 (L) 06/05/2018   CBC    Component Value Date/Time   WBC 5.4 09/06/2018 0816   RBC 4.61 09/06/2018 0816   HGB 14.3 09/06/2018 0816   HCT 42.7 09/06/2018 0816   PLT 289.0 09/06/2018 0816   MCV 92.6 09/06/2018 0816   MCH 29.9 08/23/2017 0515   MCHC 33.4 09/06/2018 0816   RDW 15.3 09/06/2018 0816   LYMPHSABS 1.5 09/06/2018 0816   MONOABS 0.4 09/06/2018 0816   EOSABS 0.1 09/06/2018 0816   BASOSABS 0.1 09/06/2018 0816   Iron/TIBC/Ferritin/ %Sat No results found for: IRON, TIBC, FERRITIN, IRONPCTSAT Lipid Panel     Component Value Date/Time   CHOL 129 06/05/2018 1017   TRIG 37 06/05/2018 1017   HDL 63 06/05/2018 1017  CHOLHDL 2.0 06/05/2018 1017   LDLCALC 59 06/05/2018 1017   Hepatic Function Panel     Component Value Date/Time   PROT 7.8 09/06/2018 0816   PROT 7.7 06/05/2018 1017   ALBUMIN 4.4 09/06/2018 0816   ALBUMIN 4.5 06/05/2018 1017   AST 13 09/06/2018 0816   ALT 11 09/06/2018 0816   ALKPHOS 63 09/06/2018 0816   BILITOT 0.5 09/06/2018 0816   BILITOT 0.4 06/05/2018 1017      Component Value Date/Time   TSH 1.44 09/06/2018 0816   TSH 0.400 (L) 06/05/2018 1017   Results for JAYSE, HAMMOND (MRN [id number]) as of 10/17/2018 15:45  Ref. Range 06/05/2018 10:17  Vitamin D, 25-Hydroxy Latest Ref Range: 30.0 - 100.0 ng/mL 21.1 (L)     I, [place] Crane, am acting as Energy manager for [place], FNP-C.  I have reviewed the above documentation for accuracy and completeness, and I agree with the above.  - Dawn Whitmire, FNP-C.

## 2018-10-24 ENCOUNTER — Encounter (INDEPENDENT_AMBULATORY_CARE_PROVIDER_SITE_OTHER): Payer: Self-pay | Admitting: Family Medicine

## 2018-11-06 ENCOUNTER — Other Ambulatory Visit (INDEPENDENT_AMBULATORY_CARE_PROVIDER_SITE_OTHER): Payer: Self-pay | Admitting: Family Medicine

## 2018-11-06 ENCOUNTER — Ambulatory Visit (INDEPENDENT_AMBULATORY_CARE_PROVIDER_SITE_OTHER): Payer: Managed Care, Other (non HMO) | Admitting: Family Medicine

## 2018-11-06 ENCOUNTER — Other Ambulatory Visit: Payer: Self-pay

## 2018-11-06 ENCOUNTER — Encounter (INDEPENDENT_AMBULATORY_CARE_PROVIDER_SITE_OTHER): Payer: Self-pay | Admitting: Family Medicine

## 2018-11-06 DIAGNOSIS — F3289 Other specified depressive episodes: Secondary | ICD-10-CM

## 2018-11-06 DIAGNOSIS — E559 Vitamin D deficiency, unspecified: Secondary | ICD-10-CM

## 2018-11-06 DIAGNOSIS — Z6832 Body mass index (BMI) 32.0-32.9, adult: Secondary | ICD-10-CM | POA: Diagnosis not present

## 2018-11-06 DIAGNOSIS — E669 Obesity, unspecified: Secondary | ICD-10-CM | POA: Diagnosis not present

## 2018-11-06 MED ORDER — VITAMIN D (ERGOCALCIFEROL) 1.25 MG (50000 UNIT) PO CAPS: 50000.0000 [IU] | ORAL_CAPSULE | ORAL | 0 refills | Status: DC

## 2018-11-06 MED ORDER — ESCITALOPRAM OXALATE 10 MG PO TABS: 10.0000 mg | ORAL_TABLET | ORAL | 0 refills | Status: DC

## 2018-11-07 ENCOUNTER — Encounter (INDEPENDENT_AMBULATORY_CARE_PROVIDER_SITE_OTHER): Payer: Self-pay | Admitting: Family Medicine

## 2018-11-07 NOTE — Progress Notes (Signed)
Office: [phone]  /  Fax: [phone] TeleHealth Visit:  Faith Hanson has verbally consented to this TeleHealth visit today. The patient is located at home, the provider is located at the [facility] office. The participants in this visit include the listed provider and patient. The visit was conducted today via FaceTime.  HPI:   Chief Complaint: OBESITY Faith Hanson is here to discuss her progress with her obesity treatment plan. She is on the Category 2 plan and is following her eating plan approximately 95% of the time. She states she is exercising 0 minutes 0 times per week. Faith Hanson states she is working from home. She states she has meals prepped for this week and is journaling 3-4 days a week. She reports meeting her protein goal. We were unable to weigh the patient today for this TeleHealth visit. She feels as if she has maintained her weight since her last visit. She has lost 11 lbs since starting treatment with [place].  Vitamin D deficiency Faith Hanson has a diagnosis of Vitamin D deficiency, which is not at goal. Her last Vitamin D level was reported to be 21.1 on 06/05/2018. She is currently taking prescription Vit D and denies nausea, vomiting or muscle weakness.  Depression with emotional eating behaviors Faith Hanson has been struggling with emotional eating and using food for comfort to the extent that it is negatively impacting her health. She has been working on behavior modification techniques to help reduce her emotional eating and has been successful. She shows no sign of suicidal or homicidal ideations. Faith Hanson reports stress eating is well controlled at this time.  Depression screen PHQ 2/9 06/05/2018  Decreased Interest 1  Down, Depressed, Hopeless 2  PHQ - 2 Score 3  Altered sleeping 0  Tired, decreased energy 3  Change in appetite 1  Feeling bad or failure about yourself  1  Trouble concentrating 1  Moving slowly or fidgety/restless 0  Suicidal thoughts  0  PHQ-9 Score 9  Difficult doing work/chores Not difficult at all   ASSESSMENT AND PLAN:  Vitamin D deficiency - Plan: Vitamin D, Ergocalciferol, (DRISDOL) 1.25 MG (50000 UT) CAPS capsule  Other depression - with emotional eating - Plan: escitalopram (LEXAPRO) 10 MG tablet  Class 1 obesity with serious comorbidity and body mass index (BMI) of 32.0 to 32.9 in adult, unspecified obesity type  PLAN:  Vitamin D Deficiency Faith Hanson was informed that low Vitamin D levels contributes to fatigue and are associated with obesity, breast, and colon cancer. She agrees to continue to take prescription Vit D @ 50,000 IU every week #4 with 0 refills and will follow-up for routine testing of Vitamin D, at least 2-3 times per year. She was informed of the risk of over-replacement of Vitamin D and agrees to not increase her dose unless she discusses this with [place] first. Jaskirat agrees to follow-up with our clinic in 2 weeks.  Depression with Emotional Eating Behaviors We discussed behavior modification techniques today to help Faith Hanson deal with her emotional eating and depression. She was given a refill on her Lexapro 10 mg QAM #30 with 0 refills and agrees to follow-up with our clinic in 2 weeks.  Obesity Faith Hanson is currently in the action stage of change. As such, her goal is to continue with weight loss efforts. She has agreed to keep a food journal with 1100-1200 calories and 85 grams of protein. She will journal 5 days per week at least.  We discussed the following Behavioral Modification Strategies  today: planning for success and keep a strict food journal. Faith Hanson has not been prescribed exercise at this time.  Faith Hanson has agreed to follow-up with our clinic in 2 weeks. She was informed of the importance of frequent follow-up visits to maximize her success with intensive lifestyle modifications for her multiple health conditions.  ALLERGIES: No Known Allergies  MEDICATIONS: Current Outpatient  Medications on File Prior to Visit  Medication Sig Dispense Refill  . levonorgestrel (MIRENA) 20 MCG/24HR IUD 1 each by Intrauterine route once.     No current facility-administered medications on file prior to visit.     PAST MEDICAL HISTORY: Past Medical History:  Diagnosis Date  . Medical history non-contributory   . Vaginal Pap smear, abnormal     PAST SURGICAL HISTORY: Past Surgical History:  Procedure Laterality Date  . CESAREAN SECTION    . LEEP      SOCIAL HISTORY: Social History   Tobacco Use  . Smoking status: Never Smoker  . Smokeless tobacco: Never Used  Substance Use Topics  . Alcohol use: Yes    Comment: rarely   . Drug use: No    FAMILY HISTORY: Family History  Problem Relation Age of Onset  . Diabetes Maternal Grandmother   . Hyperlipidemia Mother   . Obesity Mother   . Anemia Sister   . Thyroid disease Sister   . Colon cancer Maternal Aunt    ROS: Review of Systems  Gastrointestinal: Negative for nausea and vomiting.  Musculoskeletal:       Negative for muscle weakness.  Psychiatric/Behavioral: Positive for depression (emotional eating). Negative for suicidal ideas.       Negative for homicidal ideas.   PHYSICAL EXAM: Pt in no acute distress  RECENT LABS AND TESTS: BMET    Component Value Date/Time   NA 137 09/06/2018 0816   NA 136 06/05/2018 1017   K 3.9 09/06/2018 0816   CL 101 09/06/2018 0816   CO2 28 09/06/2018 0816   GLUCOSE 83 09/06/2018 0816   BUN 18 09/06/2018 0816   BUN 13 06/05/2018 1017   CREATININE 0.93 09/06/2018 0816   CALCIUM 9.5 09/06/2018 0816   GFRNONAA 80 06/05/2018 1017   GFRAA 93 06/05/2018 1017   Lab Results  Component Value Date   HGBA1C 5.3 06/05/2018   Lab Results  Component Value Date   INSULIN 1.2 (L) 06/05/2018   CBC    Component Value Date/Time   WBC 5.4 09/06/2018 0816   RBC 4.61 09/06/2018 0816   HGB 14.3 09/06/2018 0816   HCT 42.7 09/06/2018 0816   PLT 289.0 09/06/2018 0816   MCV  92.6 09/06/2018 0816   MCH 29.9 08/23/2017 0515   MCHC 33.4 09/06/2018 0816   RDW 15.3 09/06/2018 0816   LYMPHSABS 1.5 09/06/2018 0816   MONOABS 0.4 09/06/2018 0816   EOSABS 0.1 09/06/2018 0816   BASOSABS 0.1 09/06/2018 0816   Iron/TIBC/Ferritin/ %Sat No results found for: IRON, TIBC, FERRITIN, IRONPCTSAT Lipid Panel     Component Value Date/Time   CHOL 129 06/05/2018 1017   TRIG 37 06/05/2018 1017   HDL 63 06/05/2018 1017   CHOLHDL 2.0 06/05/2018 1017   LDLCALC 59 06/05/2018 1017   Hepatic Function Panel     Component Value Date/Time   PROT 7.8 09/06/2018 0816   PROT 7.7 06/05/2018 1017   ALBUMIN 4.4 09/06/2018 0816   ALBUMIN 4.5 06/05/2018 1017   AST 13 09/06/2018 0816   ALT 11 09/06/2018 0816   ALKPHOS 63 09/06/2018 0816  BILITOT 0.5 09/06/2018 0816   BILITOT 0.4 06/05/2018 1017      Component Value Date/Time   TSH 1.44 09/06/2018 0816   TSH 0.400 (L) 06/05/2018 1017   Results for Faith, Hanson (MRN [id number]) as of 11/07/2018 07:26  Ref. Range 06/05/2018 10:17  Vitamin D, 25-Hydroxy Latest Ref Range: 30.0 - 100.0 ng/mL 21.1 (L)   I, Marianna Payment, am acting as Energy manager for [place], FNP-C.  I have reviewed the above documentation for accuracy and completeness, and I agree with the above.  - Trevin Gartrell, FNP-C.

## 2018-11-21 ENCOUNTER — Other Ambulatory Visit: Payer: Self-pay

## 2018-11-21 ENCOUNTER — Ambulatory Visit (INDEPENDENT_AMBULATORY_CARE_PROVIDER_SITE_OTHER): Payer: Managed Care, Other (non HMO) | Admitting: Bariatrics

## 2018-11-21 ENCOUNTER — Encounter (INDEPENDENT_AMBULATORY_CARE_PROVIDER_SITE_OTHER): Payer: Self-pay | Admitting: Bariatrics

## 2018-11-21 DIAGNOSIS — Z6832 Body mass index (BMI) 32.0-32.9, adult: Secondary | ICD-10-CM | POA: Diagnosis not present

## 2018-11-21 DIAGNOSIS — F3289 Other specified depressive episodes: Secondary | ICD-10-CM

## 2018-11-21 DIAGNOSIS — E559 Vitamin D deficiency, unspecified: Secondary | ICD-10-CM | POA: Diagnosis not present

## 2018-11-21 DIAGNOSIS — E669 Obesity, unspecified: Secondary | ICD-10-CM | POA: Diagnosis not present

## 2018-11-22 NOTE — Progress Notes (Signed)
Office: [phone]  /  Fax: [phone] TeleHealth Visit:  Faith Hanson has verbally consented to this TeleHealth visit today. The patient is located at home, the provider is located at the [facility] office. The participants in this visit include the listed provider and patient and any and all parties involved. The visit was conducted today via FaceTime.  HPI:   Chief Complaint: OBESITY Faith Hanson is here to discuss her progress with her obesity treatment plan. She is on the Category 2 plan and is following her eating plan approximately 95 % of the time. She states she is playing with the kids and walking for 30 minutes 3 times per week. Faith Hanson states that she has lost 1 pound (weight 213 lbs). She is doing well. Faith Hanson is working from home. She if following the plan. We were unable to weigh the patient today for this TeleHealth visit. She feels as if she has lost weight since her last visit. She has lost 10 lbs since starting treatment with [place].  Vitamin D deficiency Faith Hanson has a diagnosis of vitamin D deficiency. She is currently taking vit D and denies nausea, vomiting or muscle weakness.  Depression with emotional eating behaviors Faith Hanson struggles with emotional eating and using food for comfort to the extent that it is negatively impacting her health. She often snacks when she is not hungry. Faith Hanson sometimes feels she is out of control and then feels guilty that she made poor food choices. Faith Hanson is taking Lexapro per her PCP. She has been working on behavior modification techniques to help reduce her emotional eating and has been somewhat successful. She shows no sign of suicidal or homicidal ideations.  Depression screen PHQ 2/9 06/05/2018  Decreased Interest 1  Down, Depressed, Hopeless 2  PHQ - 2 Score 3  Altered sleeping 0  Tired, decreased energy 3  Change in appetite 1  Feeling bad or failure about yourself  1  Trouble concentrating 1  Moving  slowly or fidgety/restless 0  Suicidal thoughts 0  PHQ-9 Score 9  Difficult doing work/chores Not difficult at all    ASSESSMENT AND PLAN:  Vitamin D deficiency  Other depression - with emotional eating  Class 1 obesity with serious comorbidity and body mass index (BMI) of 32.0 to 32.9 in adult, unspecified obesity type  PLAN:  Vitamin D Deficiency Faith Hanson was informed that low vitamin D levels contributes to fatigue and are associated with obesity, breast, and colon cancer. She agrees to continue to take prescription Vit D ,000 IU every week and will follow up for routine testing of vitamin D, at least 2-3 times per year. She was informed of the risk of over-replacement of vitamin D and agrees to not increase her dose unless she discusses this with [place] first.  Depression with Emotional Eating Behaviors We discussed behavior modification techniques today to help Faith Hanson deal with her emotional eating and depression. She will continue her medications and follow up as directed.  Obesity Faith Hanson is currently in the action stage of change. As such, her goal is to continue with weight loss efforts She has agreed to follow the Category 2 plan Faith Hanson will continue and increase exercise for weight loss and overall health benefits. We discussed the following Behavioral Modification Strategies today: planning for success, increase H2O intake, no skipping meals, keeping healthy foods in the home, increasing lean protein intake, decreasing simple carbohydrates, increasing vegetables, decrease eating out and work on meal planning and easy cooking plans Faith Hanson will  weigh herself at home before each visit. Breakfast options were e-mailed to patient today.  Faith Hanson has agreed to follow up with our clinic in 2 weeks. She was informed of the importance of frequent follow up visits to maximize her success with intensive lifestyle modifications for her multiple health conditions.  ALLERGIES: No  Known Allergies  MEDICATIONS: Current Outpatient Medications on File Prior to Visit  Medication Sig Dispense Refill  . escitalopram (LEXAPRO) 10 MG tablet Take 1 tablet (10 mg total) by mouth every morning. 30 tablet 0  . levonorgestrel (MIRENA) 20 MCG/24HR IUD 1 each by Intrauterine route once.    . Vitamin D, Ergocalciferol, (DRISDOL) 1.25 MG (50000 UT) CAPS capsule Take 1 capsule (50,000 Units total) by mouth every 7 (seven) days. 4 capsule 0   No current facility-administered medications on file prior to visit.     PAST MEDICAL HISTORY: Past Medical History:  Diagnosis Date  . Medical history non-contributory   . Vaginal Pap smear, abnormal     PAST SURGICAL HISTORY: Past Surgical History:  Procedure Laterality Date  . CESAREAN SECTION    . LEEP      SOCIAL HISTORY: Social History   Tobacco Use  . Smoking status: Never Smoker  . Smokeless tobacco: Never Used  Substance Use Topics  . Alcohol use: Yes    Comment: rarely   . Drug use: No    FAMILY HISTORY: Family History  Problem Relation Age of Onset  . Diabetes Maternal Grandmother   . Hyperlipidemia Mother   . Obesity Mother   . Anemia Sister   . Thyroid disease Sister   . Colon cancer Maternal Aunt     ROS: Review of Systems  Constitutional: Positive for weight loss.  Gastrointestinal: Negative for nausea and vomiting.  Musculoskeletal:       Negative for muscle weakness  Psychiatric/Behavioral: Positive for depression. Negative for suicidal ideas.    PHYSICAL EXAM: Pt in no acute distress  RECENT LABS AND TESTS: BMET    Component Value Date/Time   NA 137 09/06/2018 0816   NA 136 06/05/2018 1017   K 3.9 09/06/2018 0816   CL 101 09/06/2018 0816   CO2 28 09/06/2018 0816   GLUCOSE 83 09/06/2018 0816   BUN 18 09/06/2018 0816   BUN 13 06/05/2018 1017   CREATININE 0.93 09/06/2018 0816   CALCIUM 9.5 09/06/2018 0816   GFRNONAA 80 06/05/2018 1017   GFRAA 93 06/05/2018 1017   Lab Results   Component Value Date   HGBA1C 5.3 06/05/2018   Lab Results  Component Value Date   INSULIN 1.2 (L) 06/05/2018   CBC    Component Value Date/Time   WBC 5.4 09/06/2018 0816   RBC 4.61 09/06/2018 0816   HGB 14.3 09/06/2018 0816   HCT 42.7 09/06/2018 0816   PLT 289.0 09/06/2018 0816   MCV 92.6 09/06/2018 0816   MCH 29.9 08/23/2017 0515   MCHC 33.4 09/06/2018 0816   RDW 15.3 09/06/2018 0816   LYMPHSABS 1.5 09/06/2018 0816   MONOABS 0.4 09/06/2018 0816   EOSABS 0.1 09/06/2018 0816   BASOSABS 0.1 09/06/2018 0816   Iron/TIBC/Ferritin/ %Sat No results found for: IRON, TIBC, FERRITIN, IRONPCTSAT Lipid Panel     Component Value Date/Time   CHOL 129 06/05/2018 1017   TRIG 37 06/05/2018 1017   HDL 63 06/05/2018 1017   CHOLHDL 2.0 06/05/2018 1017   LDLCALC 59 06/05/2018 1017   Hepatic Function Panel     Component Value Date/Time  PROT 7.8 09/06/2018 0816   PROT 7.7 06/05/2018 1017   ALBUMIN 4.4 09/06/2018 0816   ALBUMIN 4.5 06/05/2018 1017   AST 13 09/06/2018 0816   ALT 11 09/06/2018 0816   ALKPHOS 63 09/06/2018 0816   BILITOT 0.5 09/06/2018 0816   BILITOT 0.4 06/05/2018 1017      Component Value Date/Time   TSH 1.44 09/06/2018 0816   TSH 0.400 (L) 06/05/2018 1017     Ref. Range 06/05/2018 10:17  Vitamin D, 25-Hydroxy Latest Ref Range: 30.0 - 100.0 ng/mL 21.1 (L)    I, [place] CraneJoanne Murray, am acting as Energy managertranscriptionist for [employer] [place] Corporation[employer]el A. Manson PasseyBrown, DO  I have reviewed the above documentation for accuracy and completeness, and I agree with the above. -Corinna CapraAngel Shameika Speelman, DO

## 2018-12-05 ENCOUNTER — Other Ambulatory Visit: Payer: Self-pay

## 2018-12-05 ENCOUNTER — Ambulatory Visit (INDEPENDENT_AMBULATORY_CARE_PROVIDER_SITE_OTHER): Payer: Managed Care, Other (non HMO) | Admitting: Bariatrics

## 2018-12-05 DIAGNOSIS — E559 Vitamin D deficiency, unspecified: Secondary | ICD-10-CM

## 2018-12-05 DIAGNOSIS — Z6832 Body mass index (BMI) 32.0-32.9, adult: Secondary | ICD-10-CM | POA: Diagnosis not present

## 2018-12-05 DIAGNOSIS — E669 Obesity, unspecified: Secondary | ICD-10-CM

## 2018-12-05 DIAGNOSIS — F3289 Other specified depressive episodes: Secondary | ICD-10-CM | POA: Diagnosis not present

## 2018-12-05 NOTE — Progress Notes (Signed)
Office: [phone]  /  Fax: [phone] TeleHealth Visit:  Faith Hanson has verbally consented to this TeleHealth visit today. The patient is located at home, the provider is located at the [facility] office. The participants in this visit include the listed provider and patient and any and all parties involved. The visit was conducted today via FaceTime.  HPI:   Chief Complaint: OBESITY Faith Hanson is here to discuss her progress with her obesity treatment plan. She is on the Category 2 plan and is following her eating plan approximately 90 % of the time. She states she is exercising 0 minutes 0 times per week. Faith Hanson states that she has gained 2 pounds (weight 213 lbs). She is doing well. Faith Hanson is doing more meal preparation. We were unable to weigh the patient today for this TeleHealth visit. She feels as if she has gained weight since her last visit. She has lost 10 lbs since starting treatment with [place].  Vitamin D deficiency Faith Hanson has a diagnosis of vitamin D deficiency. Her last vitamin D level was at 21.1 She is taking high dose vit D and denies nausea, vomiting or muscle weakness.  Depression with emotional eating behaviors Faith Hanson is eating more sweets. She struggles with emotional eating and using food for comfort to the extent that it is negatively impacting her health. She often snacks when she is not hungry. Faith Hanson sometimes feels she is out of control and then feels guilty that she made poor food choices. She is taking Lexapro. She has been working on behavior modification techniques to help reduce her emotional eating and has been somewhat successful. She shows no sign of suicidal or homicidal ideations.  Depression screen PHQ 2/9 06/05/2018  Decreased Interest 1  Down, Depressed, Hopeless 2  PHQ - 2 Score 3  Altered sleeping 0  Tired, decreased energy 3  Change in appetite 1  Feeling bad or failure about yourself  1  Trouble concentrating 1   Moving slowly or fidgety/restless 0  Suicidal thoughts 0  PHQ-9 Score 9  Difficult doing work/chores Not difficult at all    ASSESSMENT AND PLAN:  Vitamin D deficiency  Other depression - with emotional eating  Class 1 obesity with serious comorbidity and body mass index (BMI) of 32.0 to 32.9 in adult, unspecified obesity type  PLAN:  Vitamin D Deficiency Simmone was informed that low vitamin D levels contributes to fatigue and are associated with obesity, breast, and colon cancer. She agrees to continue to take prescription Vit D @50 ,000 IU every week and will follow up for routine testing of vitamin D, at least 2-3 times per year. She was informed of the risk of over-replacement of vitamin D and agrees to not increase her dose unless she discusses this with [place] first.  Depression with Emotional Eating Behaviors We discussed behavior modification techniques today to help Faith Hanson deal with her emotional eating and depression. She will continue Lexapro and follow up as directed.  Obesity Faith Hanson is currently in the action stage of change. As such, her goal is to continue with weight loss efforts She has agreed to follow the Category 2 plan Faith Hanson will consider exercise for weight loss and overall health benefits. We discussed the following Behavioral Modification Strategies today: increase H2O intake, no skipping meals, keeping healthy foods in the home, better snacking choices, increasing lean protein intake, decreasing simple carbohydrates, increasing vegetables, decrease eating out, work on meal planning and easy cooking plans, emotional eating strategies and ways  to avoid night time snacking Faith Hanson will weigh herself at home and record before each visit. She will decrease sweets. Faith Hanson will remove them from the house and she will not buy sweets.  Faith Hanson has agreed to follow up with our clinic in 2 weeks. She was informed of the importance of frequent follow up visits to maximize  her success with intensive lifestyle modifications for her multiple health conditions.  ALLERGIES: No Known Allergies  MEDICATIONS: Current Outpatient Medications on File Prior to Visit  Medication Sig Dispense Refill  . escitalopram (LEXAPRO) 10 MG tablet Take 1 tablet (10 mg total) by mouth every morning. 30 tablet 0  . levonorgestrel (MIRENA) 20 MCG/24HR IUD 1 each by Intrauterine route once.    . Vitamin D, Ergocalciferol, (DRISDOL) 1.25 MG (50000 UT) CAPS capsule Take 1 capsule (50,000 Units total) by mouth every 7 (seven) days. 4 capsule 0   No current facility-administered medications on file prior to visit.     PAST MEDICAL HISTORY: Past Medical History:  Diagnosis Date  . Medical history non-contributory   . Vaginal Pap smear, abnormal     PAST SURGICAL HISTORY: Past Surgical History:  Procedure Laterality Date  . CESAREAN SECTION    . LEEP      SOCIAL HISTORY: Social History   Tobacco Use  . Smoking status: Never Smoker  . Smokeless tobacco: Never Used  Substance Use Topics  . Alcohol use: Yes    Comment: rarely   . Drug use: No    FAMILY HISTORY: Family History  Problem Relation Age of Onset  . Diabetes Maternal Grandmother   . Hyperlipidemia Mother   . Obesity Mother   . Anemia Sister   . Thyroid disease Sister   . Colon cancer Maternal Aunt     ROS: Review of Systems  Constitutional: Negative for weight loss.  Gastrointestinal: Negative for nausea and vomiting.  Musculoskeletal:       Negative for muscle weakness  Psychiatric/Behavioral: Positive for depression. Negative for suicidal ideas.    PHYSICAL EXAM: Pt in no acute distress  RECENT LABS AND TESTS: BMET    Component Value Date/Time   NA 137 09/06/2018 0816   NA 136 06/05/2018 1017   K 3.9 09/06/2018 0816   CL 101 09/06/2018 0816   CO2 28 09/06/2018 0816   GLUCOSE 83 09/06/2018 0816   BUN 18 09/06/2018 0816   BUN 13 06/05/2018 1017   CREATININE 0.93 09/06/2018 0816    CALCIUM 9.5 09/06/2018 0816   GFRNONAA 80 06/05/2018 1017   GFRAA 93 06/05/2018 1017   Lab Results  Component Value Date   HGBA1C 5.3 06/05/2018   Lab Results  Component Value Date   INSULIN 1.2 (L) 06/05/2018   CBC    Component Value Date/Time   WBC 5.4 09/06/2018 0816   RBC 4.61 09/06/2018 0816   HGB 14.3 09/06/2018 0816   HCT 42.7 09/06/2018 0816   PLT 289.0 09/06/2018 0816   MCV 92.6 09/06/2018 0816   MCH 29.9 08/23/2017 0515   MCHC 33.4 09/06/2018 0816   RDW 15.3 09/06/2018 0816   LYMPHSABS 1.5 09/06/2018 0816   MONOABS 0.4 09/06/2018 0816   EOSABS 0.1 09/06/2018 0816   BASOSABS 0.1 09/06/2018 0816   Iron/TIBC/Ferritin/ %Sat No results found for: IRON, TIBC, FERRITIN, IRONPCTSAT Lipid Panel     Component Value Date/Time   CHOL 129 06/05/2018 1017   TRIG 37 06/05/2018 1017   HDL 63 06/05/2018 1017   CHOLHDL 2.0 06/05/2018 1017  LDLCALC 59 06/05/2018 1017   Hepatic Function Panel     Component Value Date/Time   PROT 7.8 09/06/2018 0816   PROT 7.7 06/05/2018 1017   ALBUMIN 4.4 09/06/2018 0816   ALBUMIN 4.5 06/05/2018 1017   AST 13 09/06/2018 0816   ALT 11 09/06/2018 0816   ALKPHOS 63 09/06/2018 0816   BILITOT 0.5 09/06/2018 0816   BILITOT 0.4 06/05/2018 1017      Component Value Date/Time   TSH 1.44 09/06/2018 0816   TSH 0.400 (L) 06/05/2018 1017     Ref. Range 06/05/2018 10:17  Vitamin D, 25-Hydroxy Latest Ref Range: 30.0 - 100.0 ng/mL 21.1 (L)    I, [place] CraneJoanne Murray, am acting as Energy managertranscriptionist for [place] Corporation[employer]el A. Manson PasseyBrown, DO  I have reviewed the above documentation for accuracy and completeness, and I agree with the above. -Corinna CapraAngel Brown, DO

## 2018-12-06 ENCOUNTER — Other Ambulatory Visit (INDEPENDENT_AMBULATORY_CARE_PROVIDER_SITE_OTHER): Payer: Self-pay | Admitting: Family Medicine

## 2018-12-06 ENCOUNTER — Encounter (INDEPENDENT_AMBULATORY_CARE_PROVIDER_SITE_OTHER): Payer: Self-pay | Admitting: Bariatrics

## 2018-12-06 DIAGNOSIS — E559 Vitamin D deficiency, unspecified: Secondary | ICD-10-CM

## 2018-12-06 DIAGNOSIS — F3289 Other specified depressive episodes: Secondary | ICD-10-CM

## 2018-12-19 ENCOUNTER — Ambulatory Visit (INDEPENDENT_AMBULATORY_CARE_PROVIDER_SITE_OTHER): Payer: Managed Care, Other (non HMO) | Admitting: Bariatrics

## 2018-12-20 ENCOUNTER — Other Ambulatory Visit: Payer: Self-pay

## 2018-12-20 ENCOUNTER — Encounter (INDEPENDENT_AMBULATORY_CARE_PROVIDER_SITE_OTHER): Payer: Self-pay | Admitting: Bariatrics

## 2018-12-20 ENCOUNTER — Ambulatory Visit (INDEPENDENT_AMBULATORY_CARE_PROVIDER_SITE_OTHER): Payer: Managed Care, Other (non HMO) | Admitting: Bariatrics

## 2018-12-20 DIAGNOSIS — E669 Obesity, unspecified: Secondary | ICD-10-CM

## 2018-12-20 DIAGNOSIS — E559 Vitamin D deficiency, unspecified: Secondary | ICD-10-CM | POA: Diagnosis not present

## 2018-12-20 DIAGNOSIS — F3289 Other specified depressive episodes: Secondary | ICD-10-CM

## 2018-12-20 DIAGNOSIS — Z6832 Body mass index (BMI) 32.0-32.9, adult: Secondary | ICD-10-CM

## 2018-12-21 ENCOUNTER — Telehealth (INDEPENDENT_AMBULATORY_CARE_PROVIDER_SITE_OTHER): Payer: Self-pay | Admitting: Bariatrics

## 2018-12-21 LAB — COMPREHENSIVE METABOLIC PANEL
ALT: 14 IU/L (ref 0–32)
AST: 16 IU/L (ref 0–40)
Albumin/Globulin Ratio: 1.4 (ref 1.2–2.2)
Albumin: 4.3 g/dL (ref 3.8–4.8)
Alkaline Phosphatase: 68 IU/L (ref 39–117)
BUN/Creatinine Ratio: 23 (ref 9–23)
BUN: 21 mg/dL — ABNORMAL HIGH (ref 6–20)
Bilirubin Total: 0.3 mg/dL (ref 0.0–1.2)
CO2: 24 mmol/L (ref 20–29)
Calcium: 9.4 mg/dL (ref 8.7–10.2)
Chloride: 101 mmol/L (ref 96–106)
Creatinine, Ser: 0.91 mg/dL (ref 0.57–1.00)
GFR calc Af Amer: 95 mL/min/{1.73_m2} (ref >59)
GFR calc non Af Amer: 83 mL/min/{1.73_m2} (ref >59)
Globulin, Total: 3.1 g/dL (ref 1.5–4.5)
Glucose: 87 mg/dL (ref 65–99)
Potassium: 4.5 mmol/L (ref 3.5–5.2)
Sodium: 140 mmol/L (ref 134–144)
Total Protein: 7.4 g/dL (ref 6.0–8.5)

## 2018-12-21 LAB — VITAMIN D 25 HYDROXY (VIT D DEFICIENCY, FRACTURES): Vit D, 25-Hydroxy: 42.6 ng/mL (ref 30.0–100.0)

## 2018-12-21 LAB — T4, FREE: Free T4: 3.61 ng/dL — ABNORMAL HIGH (ref 0.82–1.77)

## 2018-12-21 LAB — TSH: TSH: 0.331 u[IU]/mL — ABNORMAL LOW (ref 0.450–4.500)

## 2018-12-21 LAB — T3: T3, Total: 144 ng/dL (ref 71–180)

## 2018-12-21 NOTE — Progress Notes (Signed)
Office: [phone]  /  Fax: [phone] TeleHealth Visit:  Faith Hanson has verbally consented to this TeleHealth visit today. The patient is located in her car, the provider is located at the [employer] [facility] and [facility] office. The participants in this visit include the listed provider and patient and any and all parties involved. The visit was conducted today via FaceTime.  HPI:   Chief Complaint: OBESITY Faith Hanson is here to discuss her progress with her obesity treatment plan. She is on the Category 2 plan and is following her eating plan approximately 95 % of the time. She states she is doing cardio for 15 to 30 minutes 3 to 4 times per week. Faith Hanson has lost a couple of pounds, but she has not weighed over the last few weeks. She is not doing any emotional or stress eating. She is doing well with water and she is not drinking juice. We were unable to weigh the patient today for this TeleHealth visit. She feels as if she has lost weight since her last visit.   Vitamin D deficiency Faith Hanson has a diagnosis of vitamin D deficiency. She is currently taking high dose vit D and denies nausea, vomiting or muscle weakness.  Depression with emotional eating behaviors Faith Hanson struggles with emotional eating and using food for comfort to the extent that it is negatively impacting her health. She often snacks when she is not hungry. Faith Hanson sometimes feels she is out of control and then feels guilty that she made poor food choices. Faith Hanson is taking Lexapro per her PCP. She has been working on behavior modification techniques to help reduce her emotional eating and has been somewhat successful. She shows no sign of suicidal or homicidal ideations.  Depression screen PHQ 2/9 06/05/2018  Decreased Interest 1  Down, Depressed, Hopeless 2  PHQ - 2 Score 3  Altered sleeping 0  Tired, decreased energy 3  Change in appetite 1  Feeling bad or failure about yourself  1  Trouble concentrating 1   Moving slowly or fidgety/restless 0  Suicidal thoughts 0  PHQ-9 Score 9  Difficult doing work/chores Not difficult at all    ASSESSMENT AND PLAN:  Vitamin D deficiency - Plan: Comprehensive metabolic panel, VITAMIN D 25 Hydroxy (Vit-D Deficiency, Fractures), T3, T4, free, TSH  Other depression - with emotional eating   Class 1 obesity with serious comorbidity and body mass index (BMI) of 32.0 to 32.9 in adult, unspecified obesity type  PLAN:  Vitamin D Deficiency Faith Hanson was informed that low vitamin D levels contributes to fatigue and are associated with obesity, breast, and colon cancer. She agrees to continue to take prescription Vit D ,000 IU every week and will follow up for routine testing of vitamin D, at least 2-3 times per year. She was informed of the risk of over-replacement of vitamin D and agrees to not increase her dose unless she discusses this with [place] first.  Depression with Emotional Eating Behaviors We discussed behavior modification techniques today to help Faith Hanson deal with her emotional eating and depression. She will continue her medications per her PCP and follow up as directed.  Obesity Faith Hanson is currently in the action stage of change. As such, her goal is to continue with weight loss efforts She has agreed to follow the Category 2 plan Faith Hanson has been instructed to work up to a goal of 150 minutes of combined cardio and strengthening exercise per week for weight loss and overall health benefits. We discussed the following  Behavioral Modification Strategies today: increase H2O intake (sparkling water-Perrier), no skipping meals, keeping healthy foods in the home, increasing lean protein intake, decreasing simple carbohydrates, increasing vegetables, decrease eating out and work on meal planning and easy cooking plans Faith Hanson will weigh herself at home until she returns to the office. Food ideas for dinner were discussed with patient.  Faith Hanson has agreed to  follow up with our clinic in 2 weeks. She was informed of the importance of frequent follow up visits to maximize her success with intensive lifestyle modifications for her multiple health conditions.  ALLERGIES: No Known Allergies  MEDICATIONS: Current Outpatient Medications on File Prior to Visit  Medication Sig Dispense Refill  . escitalopram (LEXAPRO) 10 MG tablet TAKE 1 TABLET BY MOUTH IN  THE MORNING 30 tablet 0  . levonorgestrel (MIRENA) 20 MCG/24HR IUD 1 each by Intrauterine route once.    . Vitamin D, Ergocalciferol, (DRISDOL) 1.25 MG (50000 UT) CAPS capsule TAKE 1 CAPSULE BY MOUTH  EVERY 7 DAYS 4 capsule 0   No current facility-administered medications on file prior to visit.     PAST MEDICAL HISTORY: Past Medical History:  Diagnosis Date  . Medical history non-contributory   . Vaginal Pap smear, abnormal     PAST SURGICAL HISTORY: Past Surgical History:  Procedure Laterality Date  . CESAREAN SECTION    . LEEP      SOCIAL HISTORY: Social History   Tobacco Use  . Smoking status: Never Smoker  . Smokeless tobacco: Never Used  Substance Use Topics  . Alcohol use: Yes    Comment: rarely   . Drug use: No    FAMILY HISTORY: Family History  Problem Relation Age of Onset  . Diabetes Maternal Grandmother   . Hyperlipidemia Mother   . Obesity Mother   . Anemia Sister   . Thyroid disease Sister   . Colon cancer Maternal Aunt     ROS: Review of Systems  Constitutional: Positive for weight loss.  Gastrointestinal: Negative for nausea and vomiting.  Musculoskeletal:       Negative for muscle weakness  Psychiatric/Behavioral: Positive for depression. Negative for suicidal ideas.    PHYSICAL EXAM: Pt in no acute distress  RECENT LABS AND TESTS: BMET    Component Value Date/Time   NA 140 12/20/2018 1633   K 4.5 12/20/2018 1633   CL 101 12/20/2018 1633   CO2 24 12/20/2018 1633   GLUCOSE 87 12/20/2018 1633   GLUCOSE 83 09/06/2018 0816   BUN 21 (H)  12/20/2018 1633   CREATININE 0.91 12/20/2018 1633   CALCIUM 9.4 12/20/2018 1633   GFRNONAA 83 12/20/2018 1633   GFRAA 95 12/20/2018 1633   Lab Results  Component Value Date   HGBA1C 5.3 06/05/2018   Lab Results  Component Value Date   INSULIN 1.2 (L) 06/05/2018   CBC    Component Value Date/Time   WBC 5.4 09/06/2018 0816   RBC 4.61 09/06/2018 0816   HGB 14.3 09/06/2018 0816   HCT 42.7 09/06/2018 0816   PLT 289.0 09/06/2018 0816   MCV 92.6 09/06/2018 0816   MCH 29.9 08/23/2017 0515   MCHC 33.4 09/06/2018 0816   RDW 15.3 09/06/2018 0816   LYMPHSABS 1.5 09/06/2018 0816   MONOABS 0.4 09/06/2018 0816   EOSABS 0.1 09/06/2018 0816   BASOSABS 0.1 09/06/2018 0816   Iron/TIBC/Ferritin/ %Sat No results found for: IRON, TIBC, FERRITIN, IRONPCTSAT Lipid Panel     Component Value Date/Time   CHOL 129 06/05/2018 1017  TRIG 37 06/05/2018 1017   HDL 63 06/05/2018 1017   CHOLHDL 2.0 06/05/2018 1017   LDLCALC 59 06/05/2018 1017   Hepatic Function Panel     Component Value Date/Time   PROT 7.4 12/20/2018 1633   ALBUMIN 4.3 12/20/2018 1633   AST 16 12/20/2018 1633   ALT 14 12/20/2018 1633   ALKPHOS 68 12/20/2018 1633   BILITOT 0.3 12/20/2018 1633      Component Value Date/Time   TSH 0.331 (L) 12/20/2018 1633   TSH 1.44 09/06/2018 0816   TSH 0.400 (L) 06/05/2018 1017     Ref. Range 12/20/2018 16:33  Vitamin D, 25-Hydroxy Latest Ref Range: 30.0 - 100.0 ng/mL 42.6    I, [place] CraneJoanne Murray, am acting as Energy managertranscriptionist for [employer] Corporationngel A. Manson Passey, DO  I have reviewed the above documentation for accuracy and completeness, and I agree with the above. -Corinna CapraAngel , DO

## 2018-12-21 NOTE — Telephone Encounter (Signed)
Phone call to patient regarding her abnormal thyroid panel. She was seen by Dr. Lonzo Cloud Adolph Pollack ) per referral from me in March. I discussed with patient and will send information to Dr. Lonzo Cloud. Pt voiced understanding and will call the endocrinology office if someone from the endocrinology office does not call next week.

## 2019-01-03 ENCOUNTER — Other Ambulatory Visit: Payer: Self-pay

## 2019-01-03 ENCOUNTER — Ambulatory Visit (INDEPENDENT_AMBULATORY_CARE_PROVIDER_SITE_OTHER): Payer: 59 | Admitting: Internal Medicine

## 2019-01-03 ENCOUNTER — Encounter: Payer: Self-pay | Admitting: Internal Medicine

## 2019-01-03 VITALS — BP 102/76 | HR 98 | Temp 98.1°F | Ht 67.0 in | Wt 213.6 lb

## 2019-01-03 DIAGNOSIS — E0590 Thyrotoxicosis, unspecified without thyrotoxic crisis or storm: Secondary | ICD-10-CM | POA: Diagnosis not present

## 2019-01-03 NOTE — Progress Notes (Signed)
Name: Faith Hanson  MRN/ DOB: [id number], 03/28/1986      Age/ Sex: 33 y.o., female     PCP: Verdene RioBrown, Angela, MD   Reason for Endocrinology Evaluation: Hyperthyroidism     Initial Endocrinology Clinic Visit: 09/06/2018    PATIENT IDENTIFIER: Faith Hanson is a 33 y.o., female with a past medical history of obesity . She has followed with [facility] since 09/06/2018 for consultative assistance with management of her hyperthyroidism.   HISTORICAL SUMMARY: The patient was first diagnosed with hyperthyroidism through the wellness clinic when she was noted to have abnormal TFT's on routine work up with a TSH of 0.400 uIU/mL with high FT4 3.31 ng/dL and elevated T3 at 213280 ng/dL.   She is s/p delivery of a baby boy 08/2017  SUBJECTIVE:   During last visit (09/06/18): Clinically and bio-chemicaly she was euthyroid. No intervention provided.   Today (01/03/2019):  Faith Hanson is here for a follow up on hyperthyroidism. In February, 2020 her TFT's have normalized but repeat labs in 12/2018 again showed low TSH with elevated FT4 and normal T3.    Weight has been stable. Denies heat intolerance, diarrhea or palpitations.  Denies shaking or jittery sensation  Denies local neck symptoms   Denies using oTC supplements, restarted MVI last week  Sister with hyperthyroidism    ROS:  As per HPI.   HISTORY:  Past Medical History:  Past Medical History:  Diagnosis Date  . Medical history non-contributory   . Vaginal Pap smear, abnormal    Past Surgical History:  Past Surgical History:  Procedure Laterality Date  . CESAREAN SECTION    . LEEP      Social History:  reports that she has never smoked. She has never used smokeless tobacco. She reports current alcohol use. She reports that she does not use drugs. Family History:  Family History  Problem Relation Age of Onset  . Diabetes Maternal Grandmother   . Hyperlipidemia Mother   . Obesity Mother   .  Anemia Sister   . Thyroid disease Sister   . Colon cancer Maternal Aunt      HOME MEDICATIONS: Allergies as of 01/03/2019   No Known Allergies     Medication List       Accurate as of January 03, 2019  7:54 AM. If you have any questions, ask your nurse or doctor.        escitalopram 10 MG tablet Commonly known as: LEXAPRO TAKE 1 TABLET BY MOUTH IN  THE MORNING   levonorgestrel 20 MCG/24HR IUD Commonly known as: MIRENA 1 each by Intrauterine route once.   Vitamin D (Ergocalciferol) 1.25 MG (50000 UT) Caps capsule Commonly known as: DRISDOL TAKE 1 CAPSULE BY MOUTH  EVERY 7 DAYS       DATA REVIEWED:  Results for Faith Hanson, Faith Hanson (MRN [id number]) as of 01/03/2019 10:53  Ref. Range 06/05/2018 10:17 09/06/2018 08:16 12/20/2018 16:33  TSH Latest Ref Range: 0.450 - 4.500 uIU/mL 0.400 (L) 1.44 0.331 (L)  Triiodothyronine (T3) Latest Ref Range: 71 - 180 ng/dL 629280 (H) 528120 413144  K4,MWNU(UVOZDGT4,Free(Direct) Latest Ref Range: 0.82 - 1.77 ng/dL 6.443.31 (H) 0.341.02 7.423.61 (H)   Results for Faith Hanson, Faith Hanson (MRN [id number]) as of 10/13/2018 16:21  Ref. Range 09/06/2018 08:16  TRAB Latest Ref Range: <=2.00 IU/L <1.00     ASSESSMENT / PLAN / RECOMMENDATIONS:   1. Hyperthyroidism :   - Clinically she is euthyroid  - No local neck  symptoms - TRAb has been negative in the past  - Will repeat labs and check TSI levels - Will proceed with thyroid uptake and scan  - No thionamide therapy will be offered at this time since she is asymptomatic.    Labs in 6 weeks  F/u in 3 months      Signed electronically by: Mack Guise, MD  [facility] Endocrinology  [facility] [address] [place], [place] [address] Phone: [phone] FAX: [phone]      CC: Faith Due, MD No address on file Phone: None  Fax: [phone]   Return to Endocrinology clinic as below: Future Appointments  Date Time Provider [place]  01/03/2019  8:10 AM Shamleffer,  Melanie Crazier, MD [facility] None  01/08/2019  3:40 PM Georgia Lopes, DO [facility] None

## 2019-01-03 NOTE — Patient Instructions (Signed)
Thyroid Uptake and Scan works like this: We would first check a thyroid "scan" (a special, but easy and painless type of thyroid x ray).  you go to the x-ray department of the hospital to swallow a pill, which contains a miniscule amount of radiation.  You will not notice any symptoms from this.  You will go back to the x-ray department the next day, to lie down in front of a camera.  The results of this will be sent to me.    

## 2019-01-04 ENCOUNTER — Telehealth: Payer: Self-pay | Admitting: Internal Medicine

## 2019-01-04 NOTE — Telephone Encounter (Signed)
Can you please speak to the pt about why she didn't stop top have labs yesterday ? I explained to her twice that we were going to do labs that same day, she asked for [facility], and I told her that we can draw it here and send it to [place], MD  [facility] Endocrinology  [facility] [address] [place], [place] [address] Phone: [phone] FAX: [phone]

## 2019-01-04 NOTE — Telephone Encounter (Signed)
lft vm for pt to return call to have labs scheduled.

## 2019-01-08 ENCOUNTER — Encounter (INDEPENDENT_AMBULATORY_CARE_PROVIDER_SITE_OTHER): Payer: Self-pay | Admitting: Bariatrics

## 2019-01-08 ENCOUNTER — Ambulatory Visit (INDEPENDENT_AMBULATORY_CARE_PROVIDER_SITE_OTHER): Payer: Managed Care, Other (non HMO) | Admitting: Bariatrics

## 2019-01-08 ENCOUNTER — Other Ambulatory Visit: Payer: Self-pay

## 2019-01-08 ENCOUNTER — Other Ambulatory Visit (INDEPENDENT_AMBULATORY_CARE_PROVIDER_SITE_OTHER): Payer: Self-pay | Admitting: Bariatrics

## 2019-01-08 DIAGNOSIS — Z6832 Body mass index (BMI) 32.0-32.9, adult: Secondary | ICD-10-CM

## 2019-01-08 DIAGNOSIS — E559 Vitamin D deficiency, unspecified: Secondary | ICD-10-CM | POA: Diagnosis not present

## 2019-01-08 DIAGNOSIS — E669 Obesity, unspecified: Secondary | ICD-10-CM

## 2019-01-08 DIAGNOSIS — E0590 Thyrotoxicosis, unspecified without thyrotoxic crisis or storm: Secondary | ICD-10-CM

## 2019-01-08 MED ORDER — VITAMIN D (ERGOCALCIFEROL) 1.25 MG (50000 UNIT) PO CAPS: ORAL_CAPSULE | ORAL | 0 refills | Status: DC

## 2019-01-09 NOTE — Progress Notes (Signed)
Office: [phone]  /  Fax: [phone] TeleHealth Visit:  Faith Hanson has verbally consented to this TeleHealth visit today. The patient is located at home, the provider is located at the [employer] [facility] office. The participants in this visit include the listed provider and patient. The visit was conducted today via FaceTime.  HPI:   Chief Complaint: OBESITY Faith Hanson is here to discuss her progress with her obesity treatment plan. She is on the Category 2 plan and is following her eating plan approximately 85% of the time. She states she is kickboxing 25-30 minutes 4 times per week. Faith Hanson states that her weight has remained the same and states that her weight last week was 212 lbs. She is continuing to breastfeed. We were unable to weigh the patient today for this TeleHealth visit. She feels as if she has maintained her weight since her last visit. She has lost 11 lbs since starting treatment with [place].  Vitamin D deficiency Faith Hanson has a diagnosis of Vitamin D deficiency. She is currently taking prescription Vit D and denies nausea, vomiting or muscle weakness.  Hypothyroidism Faith Hanson has a diagnosis of hypothyroidism. On telephone call to the patient on 12/21/2018, she was to contact Endocrinology (Dr. Kelton Pillar). She saw her endocrinologist on 01/03/2019.  ASSESSMENT AND PLAN:  Vitamin D deficiency - Plan: Vitamin D, Ergocalciferol, (DRISDOL) 1.25 MG (50000 UT) CAPS capsule  Hyperthyroidism  Class 1 obesity with serious comorbidity and body mass index (BMI) of 32.0 to 32.9 in adult, unspecified obesity type  PLAN:  Vitamin D Deficiency Faith Hanson was informed that low Vitamin D levels contributes to fatigue and are associated with obesity, breast, and colon cancer. She agrees to continue to take prescription Vit D @ 50,000 IU every week #4 with 0 refills and will follow-up for routine testing of Vitamin D, at least 2-3 times per year. She was informed of the risk  of over-replacement of Vitamin D and agrees to not increase her dose unless she discusses this with [place] first. Sarely agrees to follow-up with our clinic in 2 weeks.  Hypothyroidism Faith Hanson was informed of the importance of good thyroid control to help with weight loss efforts. She was also informed that supertheraputic thyroid levels are dangerous and will not improve weight loss results.  Faith Hanson will follow-up with Endocrinology (plan thyroid uptake scan).  Obesity Faith Hanson is currently in the action stage of change. As such, her goal is to continue with weight loss efforts. She has agreed to follow the Category 2 plan. Faith Hanson will weigh herself at home, work on meal planning, and decrease starches. Faith Hanson has been instructed to continue her current exercise regimen for weight loss and overall health benefits. We discussed the following Behavioral Modification Strategies today: increasing lean protein intake, decreasing simple carbohydrates, increasing vegetables, increase H20 intake, decrease eating out, no skipping meals, work on meal planning and easy cooking plans, and keeping healthy foods in the home.  Faith Hanson has agreed to follow-up with our clinic in 2 weeks. She was informed of the importance of frequent follow-up visits to maximize her success with intensive lifestyle modifications for her multiple health conditions.  ALLERGIES: No Known Allergies  MEDICATIONS: Current Outpatient Medications on File Prior to Visit  Medication Sig Dispense Refill  . escitalopram (LEXAPRO) 10 MG tablet TAKE 1 TABLET BY MOUTH IN  THE MORNING 30 tablet 0  . levonorgestrel (MIRENA) 20 MCG/24HR IUD 1 each by Intrauterine route once.     No current facility-administered medications on  file prior to visit.     PAST MEDICAL HISTORY: Past Medical History:  Diagnosis Date  . Medical history non-contributory   . Vaginal Pap smear, abnormal     PAST SURGICAL HISTORY: Past Surgical History:   Procedure Laterality Date  . CESAREAN SECTION    . LEEP      SOCIAL HISTORY: Social History   Tobacco Use  . Smoking status: Never Smoker  . Smokeless tobacco: Never Used  Substance Use Topics  . Alcohol use: Yes    Comment: rarely   . Drug use: No    FAMILY HISTORY: Family History  Problem Relation Age of Onset  . Diabetes Maternal Grandmother   . Hyperlipidemia Mother   . Obesity Mother   . Anemia Sister   . Thyroid disease Sister   . Colon cancer Maternal Aunt    ROS: Review of Systems  Gastrointestinal: Negative for nausea and vomiting.  Musculoskeletal:       Negative for muscle weakness.   PHYSICAL EXAM: Pt in no acute distress  RECENT LABS AND TESTS: BMET    Component Value Date/Time   NA 140 12/20/2018 1633   K 4.5 12/20/2018 1633   CL 101 12/20/2018 1633   CO2 24 12/20/2018 1633   GLUCOSE 87 12/20/2018 1633   GLUCOSE 83 09/06/2018 0816   BUN 21 (H) 12/20/2018 1633   CREATININE 0.91 12/20/2018 1633   CALCIUM 9.4 12/20/2018 1633   GFRNONAA 83 12/20/2018 1633   GFRAA 95 12/20/2018 1633   Lab Results  Component Value Date   HGBA1C 5.3 06/05/2018   Lab Results  Component Value Date   INSULIN 1.2 (L) 06/05/2018   CBC    Component Value Date/Time   WBC 5.4 09/06/2018 0816   RBC 4.61 09/06/2018 0816   HGB 14.3 09/06/2018 0816   HCT 42.7 09/06/2018 0816   PLT 289.0 09/06/2018 0816   MCV 92.6 09/06/2018 0816   MCH 29.9 08/23/2017 0515   MCHC 33.4 09/06/2018 0816   RDW 15.3 09/06/2018 0816   LYMPHSABS 1.5 09/06/2018 0816   MONOABS 0.4 09/06/2018 0816   EOSABS 0.1 09/06/2018 0816   BASOSABS 0.1 09/06/2018 0816   Iron/TIBC/Ferritin/ %Sat No results found for: IRON, TIBC, FERRITIN, IRONPCTSAT Lipid Panel     Component Value Date/Time   CHOL 129 06/05/2018 1017   TRIG 37 06/05/2018 1017   HDL 63 06/05/2018 1017   CHOLHDL 2.0 06/05/2018 1017   LDLCALC 59 06/05/2018 1017   Hepatic Function Panel     Component Value Date/Time   PROT  7.4 12/20/2018 1633   ALBUMIN 4.3 12/20/2018 1633   AST 16 12/20/2018 1633   ALT 14 12/20/2018 1633   ALKPHOS 68 12/20/2018 1633   BILITOT 0.3 12/20/2018 1633      Component Value Date/Time   TSH 0.331 (L) 12/20/2018 1633   TSH 1.44 09/06/2018 0816   TSH 0.400 (L) 06/05/2018 1017   Results for Heath LarkMCMILLAN, Faith Hanson (MRN [id number]) as of 01/09/2019 11:03  Ref. Range 12/20/2018 16:33  Vitamin D, 25-Hydroxy Latest Ref Range: 30.0 - 100.0 ng/mL 42.6    I, Marianna Paymentenise Haag, am acting as Energy managertranscriptionist for [employer] Energy[employer]el Tenae Graziosi, DO   I have reviewed the above documentation for accuracy and completeness, and I agree with the above. -Corinna CapraAngel Ayvin Lipinski, DO

## 2019-01-09 NOTE — Telephone Encounter (Signed)
Spoke with the patient today and she was very reluctant to come back for labs since she has done them recently-I made patient aware that MD would like to check these levels again and she agreed-she will come in tomorrow to have labs drawn

## 2019-01-10 ENCOUNTER — Other Ambulatory Visit: Payer: Self-pay

## 2019-01-10 ENCOUNTER — Other Ambulatory Visit (INDEPENDENT_AMBULATORY_CARE_PROVIDER_SITE_OTHER): Payer: Managed Care, Other (non HMO)

## 2019-01-10 DIAGNOSIS — E0590 Thyrotoxicosis, unspecified without thyrotoxic crisis or storm: Secondary | ICD-10-CM

## 2019-01-11 ENCOUNTER — Telehealth: Payer: Self-pay | Admitting: Internal Medicine

## 2019-01-11 LAB — T4, FREE: Free T4: 2.08 ng/dL — ABNORMAL HIGH (ref 0.82–1.77)

## 2019-01-11 LAB — THYROID STIMULATING IMMUNOGLOBULIN: Thyroid Stim Immunoglobulin: <0.1 IU/L (ref 0.00–0.55)

## 2019-01-11 LAB — T3, FREE: T3, Free: 4.9 pg/mL — ABNORMAL HIGH (ref 2.0–4.4)

## 2019-01-11 LAB — TSH: TSH: 0.508 u[IU]/mL (ref 0.450–4.500)

## 2019-01-11 NOTE — Telephone Encounter (Signed)
Left a message for a call back.

## 2019-01-12 ENCOUNTER — Other Ambulatory Visit: Payer: Self-pay

## 2019-01-15 ENCOUNTER — Telehealth: Payer: Self-pay | Admitting: Internal Medicine

## 2019-01-15 DIAGNOSIS — E0590 Thyrotoxicosis, unspecified without thyrotoxic crisis or storm: Secondary | ICD-10-CM

## 2019-01-15 NOTE — Telephone Encounter (Signed)
Discussed abnormal lab results which don't match her clinical picture.    We also discussed that an elevated FT4 and T3 is very unusual in the presence of normal TSH but the opposite is common .   Spoke to [employer] about antibody interference. Pt will need to stop by the lab and her sample will be sent to a specialty lab in [place]    Pt states she will stop by th lab tomorrow     [place], MD  [facility] Endocrinology  [facility] [address] [place], [place] [address] Phone: [phone] FAX: [phone]

## 2019-01-16 ENCOUNTER — Other Ambulatory Visit: Payer: 59

## 2019-01-16 ENCOUNTER — Other Ambulatory Visit: Payer: Self-pay | Admitting: Internal Medicine

## 2019-01-16 ENCOUNTER — Other Ambulatory Visit: Payer: Self-pay

## 2019-01-16 DIAGNOSIS — E0590 Thyrotoxicosis, unspecified without thyrotoxic crisis or storm: Secondary | ICD-10-CM

## 2019-01-17 LAB — T3, FREE: T3, Free: 5.1 pg/mL — ABNORMAL HIGH (ref 2.0–4.4)

## 2019-01-17 LAB — TSH: TSH: 0.643 u[IU]/mL (ref 0.450–4.500)

## 2019-01-17 LAB — T4, FREE: Free T4: 2.22 ng/dL — ABNORMAL HIGH (ref 0.82–1.77)

## 2019-01-23 ENCOUNTER — Telehealth (INDEPENDENT_AMBULATORY_CARE_PROVIDER_SITE_OTHER): Payer: Managed Care, Other (non HMO) | Admitting: Bariatrics

## 2019-01-23 ENCOUNTER — Other Ambulatory Visit: Payer: Self-pay

## 2019-01-23 ENCOUNTER — Encounter (INDEPENDENT_AMBULATORY_CARE_PROVIDER_SITE_OTHER): Payer: Self-pay | Admitting: Bariatrics

## 2019-01-23 DIAGNOSIS — E669 Obesity, unspecified: Secondary | ICD-10-CM | POA: Diagnosis not present

## 2019-01-23 DIAGNOSIS — E0590 Thyrotoxicosis, unspecified without thyrotoxic crisis or storm: Secondary | ICD-10-CM

## 2019-01-23 DIAGNOSIS — E559 Vitamin D deficiency, unspecified: Secondary | ICD-10-CM

## 2019-01-23 DIAGNOSIS — Z6834 Body mass index (BMI) 34.0-34.9, adult: Secondary | ICD-10-CM

## 2019-01-23 MED ORDER — VITAMIN D (ERGOCALCIFEROL) 1.25 MG (50000 UNIT) PO CAPS: 50000.0000 [IU] | ORAL_CAPSULE | ORAL | 0 refills | Status: DC

## 2019-01-23 NOTE — Progress Notes (Addendum)
Office: [phone]  /  Fax: [phone] TeleHealth Visit:  Faith LarkShontee M Hanson has verbally consented to this TeleHealth visit today. The patient is located at home, the provider is located at the [facility] office. The participants in this visit include the listed provider and patient and any and all parties involved. The visit was conducted today via FaceTime.  HPI:   Chief Complaint: OBESITY Faith CharonShontee is here to discuss her progress with her obesity treatment plan. She is on the Category 2 plan and is following her eating plan approximately 85 % of the time. She states she is exercising 0 minutes 0 times per week. Faith Hanson states that her weight remains the same (weight 213 lbs). She is doing well. We were unable to weigh the patient today for this TeleHealth visit. She feels as if she has maintained weight since her last visit. She has lost 10 lbs since starting treatment with us.  Vitamin D deficiency Faith Hanson has a diagnosis of vitamin D deficiency. She is taking high dose vit D and denies nausea, vomiting or muscle weakness.  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 She denies increased temperature sensitivity and her energy is normal ( clinically euthyroid ).   ASSESSMENT AND PLAN:  Vitamin D deficiency  Hyperthyroidism  Class 1 obesity with serious comorbidity and body mass index (BMI) of 34.0 to 34.9 in adult, unspecified obesity type  PLAN:  Vitamin D Deficiency Faith Hanson was informed that low vitamin D levels contributes to fatigue and are associated with obesity, breast, and colon cancer. She agrees to continue to take prescription Vit D @50 ,000 IU every 14 days #9 with no refills and will follow up for routine testing of vitamin D, at least 2-3 times per year. She was informed of the risk of over-replacement of vitamin D and agrees to not increase her dose unless she discusses this with us first.  Faith CharonShontee agrees to follow up with our clinic in 2 weeks.  Hyperthyroidism Faith Hanson will continue to follow up with endocrinology.  Obesity Faith Hanson is currently in the action stage of change. As such, her goal is to continue with weight loss efforts She has agreed to follow the Category 2 plan Faith CharonShontee has been instructed to work up to a goal of 150 minutes of combined cardio and strengthening exercise per week for weight loss and overall health benefits. We discussed the following Behavioral Modification Strategies today: planning for success, increase H20 intake, no skipping meals, keeping healthy foods in the home, better snacking choices, increasing lean protein intake, decreasing simple carbohydrates, increasing vegetables, decrease eating out and work on meal planning and intentional eating  Faith CharonShontee has agreed to follow up with our clinic in 2 weeks. She was informed of the importance of frequent follow up visits to maximize her success with intensive lifestyle modifications for her multiple health conditions.  ALLERGIES: No Known Allergies  MEDICATIONS: Current Outpatient Medications on File Prior to Visit  Medication Sig Dispense Refill  . escitalopram (LEXAPRO) 10 MG tablet TAKE 1 TABLET BY MOUTH IN  THE MORNING 30 tablet 0  . levonorgestrel (MIRENA) 20 MCG/24HR IUD 1 each by Intrauterine route once.     No current facility-administered medications on file prior to visit.     PAST MEDICAL HISTORY: Past Medical History:  Diagnosis Date  . Medical history non-contributory   . Vaginal Pap smear, abnormal     PAST SURGICAL HISTORY: Past Surgical History:  Procedure Laterality Date  .  CESAREAN SECTION    . LEEP      SOCIAL HISTORY: Social History   Tobacco Use  . Smoking status: Never Smoker  . Smokeless tobacco: Never Used  Substance Use Topics  . Alcohol use: Yes    Comment: rarely   . Drug use: No    FAMILY HISTORY: Family History  Problem Relation Age of  Onset  . Diabetes Maternal Grandmother   . Hyperlipidemia Mother   . Obesity Mother   . Anemia Sister   . Thyroid disease Sister   . Colon cancer Maternal Aunt     ROS: Review of Systems  Constitutional: Negative for malaise/fatigue and weight loss.  Gastrointestinal: Negative for nausea and vomiting.  Musculoskeletal:       Negative for muscle weakness  Endo/Heme/Allergies:       Negative for heat or cold intolerance    PHYSICAL EXAM: Pt in no acute distress  RECENT LABS AND TESTS: BMET    Component Value Date/Time   NA 140 12/20/2018 1633   K 4.5 12/20/2018 1633   CL 101 12/20/2018 1633   CO2 24 12/20/2018 1633   GLUCOSE 87 12/20/2018 1633   GLUCOSE 83 09/06/2018 0816   BUN 21 (H) 12/20/2018 1633   CREATININE 0.91 12/20/2018 1633   CALCIUM 9.4 12/20/2018 1633   GFRNONAA 83 12/20/2018 1633   GFRAA 95 12/20/2018 1633   Lab Results  Component Value Date   HGBA1C 5.3 06/05/2018   Lab Results  Component Value Date   INSULIN 1.2 (L) 06/05/2018   CBC    Component Value Date/Time   WBC 5.4 09/06/2018 0816   RBC 4.61 09/06/2018 0816   HGB 14.3 09/06/2018 0816   HCT 42.7 09/06/2018 0816   PLT 289.0 09/06/2018 0816   MCV 92.6 09/06/2018 0816   MCH 29.9 08/23/2017 0515   MCHC 33.4 09/06/2018 0816   RDW 15.3 09/06/2018 0816   LYMPHSABS 1.5 09/06/2018 0816   MONOABS 0.4 09/06/2018 0816   EOSABS 0.1 09/06/2018 0816   BASOSABS 0.1 09/06/2018 0816   Iron/TIBC/Ferritin/ %Sat No results found for: IRON, TIBC, FERRITIN, IRONPCTSAT Lipid Panel     Component Value Date/Time   CHOL 129 06/05/2018 1017   TRIG 37 06/05/2018 1017   HDL 63 06/05/2018 1017   CHOLHDL 2.0 06/05/2018 1017   LDLCALC 59 06/05/2018 1017   Hepatic Function Panel     Component Value Date/Time   PROT 7.4 12/20/2018 1633   ALBUMIN 4.3 12/20/2018 1633   AST 16 12/20/2018 1633   ALT 14 12/20/2018 1633   ALKPHOS 68 12/20/2018 1633   BILITOT 0.3 12/20/2018 1633      Component Value  Date/Time   TSH 0.643 01/16/2019 1551   TSH 0.508 01/10/2019 1547   TSH 0.331 (L) 12/20/2018 1633     Ref. Range 12/20/2018 16:33  Vitamin D, 25-Hydroxy Latest Ref Range: 30.0 - 100.0 ng/mL 42.6    I, Doreene Nest, am acting as Location manager for [employer]. Owens Shark, DO  I have reviewed the above documentation for accuracy and completeness, and I agree with the above. -Jearld Lesch, DO

## 2019-01-25 LAB — THYROID STIMULATING HORMONE: TSH: 1.3 uU/mL

## 2019-01-25 LAB — TRIIODOTHYRONINE (T-3), SERUM: Triiodothyronine (T-3), Serum: 86 ng/dL

## 2019-01-25 LAB — FREE THYROXINE: Free Thyroxine: 1.31 ng/dL

## 2019-01-26 ENCOUNTER — Telehealth: Payer: Self-pay | Admitting: Internal Medicine

## 2019-01-26 ENCOUNTER — Encounter: Payer: Self-pay | Admitting: Internal Medicine

## 2019-01-26 NOTE — Telephone Encounter (Signed)
Attempted to call the patient, no answer, left a message stating I will be sending her a letter.     Abby Nena Jordan, MD  [facility]  [facility] [address] [place], [place] [address] Phone: [phone] FAX: [phone]

## 2019-02-06 ENCOUNTER — Telehealth (INDEPENDENT_AMBULATORY_CARE_PROVIDER_SITE_OTHER): Payer: Managed Care, Other (non HMO) | Admitting: Bariatrics

## 2019-02-06 ENCOUNTER — Other Ambulatory Visit: Payer: Self-pay

## 2019-02-06 ENCOUNTER — Encounter (INDEPENDENT_AMBULATORY_CARE_PROVIDER_SITE_OTHER): Payer: Self-pay | Admitting: Bariatrics

## 2019-02-06 DIAGNOSIS — Z6834 Body mass index (BMI) 34.0-34.9, adult: Secondary | ICD-10-CM

## 2019-02-06 DIAGNOSIS — E669 Obesity, unspecified: Secondary | ICD-10-CM | POA: Diagnosis not present

## 2019-02-06 DIAGNOSIS — E0590 Thyrotoxicosis, unspecified without thyrotoxic crisis or storm: Secondary | ICD-10-CM

## 2019-02-06 DIAGNOSIS — E559 Vitamin D deficiency, unspecified: Secondary | ICD-10-CM | POA: Diagnosis not present

## 2019-02-07 NOTE — Progress Notes (Signed)
Office: [phone]  /  Fax: [phone] TeleHealth Visit:  Faith LarkShontee M Hanson has verbally consented to this TeleHealth visit today. The patient is located at home, the provider is located at the [facility] office. The participants in this visit include the listed provider and patient. The visit was conducted today via FaceTime.  HPI:   Chief Complaint: OBESITY Faith CharonShontee is here to discuss her progress with her obesity treatment plan. She is on the Category 2 plan and is following her eating plan approximately 90% of the time. She states she is exercising 0 minutes 0 times per week. Faith Hanson is about 1/2 lb down (weight 253). She reports drinking enough water and is not having any cravings. We were unable to weigh the patient today for this TeleHealth visit. She feels as if she has maintained her weight since her last visit. She has lost 11 lbs since starting treatment with us.  Vitamin D deficiency Faith Hanson has a diagnosis of Vitamin D deficiency. Her last Vitamin D level was 42.6 on 12/20/2018. She is currently taking Vit D and denies nausea, vomiting or muscle weakness.  Hypothyroidism Faith Hanson has a diagnosis of hypothyroidism. She has no signs/symptoms and is seeing an endocrinologist.  ASSESSMENT AND PLAN:  Vitamin D deficiency  Hyperthyroidism  Class 1 obesity with serious comorbidity and body mass index (BMI) of 34.0 to 34.9 in adult, unspecified obesity type  PLAN:  Vitamin D Deficiency Faith Hanson was informed that low Vitamin D levels contributes to fatigue and are associated with obesity, breast, and colon cancer. She agrees to continue taking Vit D and will follow-up for routine testing of Vitamin D, at least 2-3 times per year. She was informed of the risk of over-replacement of Vitamin D and agrees to not increase her dose unless she discusses this with us first. Faith CharonShontee agrees to follow-up with our clinic in 2 weeks.  Hypothyroidism Faith Hanson was informed of  the importance of good thyroid control to help with weight loss efforts. She was also informed that supertheraputic thyroid levels are dangerous and will not improve weight loss results. Faith Hanson will continue to follow-up with her endocrinologist (testing done).  Obesity Faith Hanson is currently in the action stage of change. As such, her goal is to continue with weight loss efforts. She has agreed to follow a lower carbohydrate, vegetable and lean protein rich diet plan. Faith Hanson will work on meal planning, intentional eating, and increasing her protein (guide for protein given). Faith Hanson has been instructed to start back at lunch cardio kickboxing for weight loss and overall health benefits. We discussed the following Behavioral Modification Strategies today: increasing lean protein intake, decreasing simple carbohydrates, increasing vegetables, increase H20 intake, decrease eating out, no skipping meals, work on meal planning and easy cooking plans, and keeping healthy foods in the home.  Faith Hanson has agreed to follow-up with our clinic in 2 weeks. She was informed of the importance of frequent follow-up visits to maximize her success with intensive lifestyle modifications for her multiple health conditions.  ALLERGIES: No Known Allergies  MEDICATIONS: Current Outpatient Medications on File Prior to Visit  Medication Sig Dispense Refill  . escitalopram (LEXAPRO) 10 MG tablet TAKE 1 TABLET BY MOUTH IN  THE MORNING 30 tablet 0  . levonorgestrel (MIRENA) 20 MCG/24HR IUD 1 each by Intrauterine route once.    . Vitamin D, Ergocalciferol, (DRISDOL) 1.25 MG (50000 UT) CAPS capsule Take 1 capsule (50,000 Units total) by mouth every 14 (fourteen) days. 8 capsule 0  No current facility-administered medications on file prior to visit.     PAST MEDICAL HISTORY: Past Medical History:  Diagnosis Date  . Medical history non-contributory   . Vaginal Pap smear, abnormal     PAST SURGICAL HISTORY: Past  Surgical History:  Procedure Laterality Date  . CESAREAN SECTION    . LEEP      SOCIAL HISTORY: Social History   Tobacco Use  . Smoking status: Never Smoker  . Smokeless tobacco: Never Used  Substance Use Topics  . Alcohol use: Yes    Comment: rarely   . Drug use: No    FAMILY HISTORY: Family History  Problem Relation Age of Onset  . Diabetes Maternal Grandmother   . Hyperlipidemia Mother   . Obesity Mother   . Anemia Sister   . Thyroid disease Sister   . Colon cancer Maternal Aunt    ROS: Review of Systems  Gastrointestinal: Negative for nausea and vomiting.  Musculoskeletal:       Negative for muscle weakness.   PHYSICAL EXAM: Pt in no acute distress  RECENT LABS AND TESTS: BMET    Component Value Date/Time   NA 140 12/20/2018 1633   K 4.5 12/20/2018 1633   CL 101 12/20/2018 1633   CO2 24 12/20/2018 1633   GLUCOSE 87 12/20/2018 1633   GLUCOSE 83 09/06/2018 0816   BUN 21 (H) 12/20/2018 1633   CREATININE 0.91 12/20/2018 1633   CALCIUM 9.4 12/20/2018 1633   GFRNONAA 83 12/20/2018 1633   GFRAA 95 12/20/2018 1633   Lab Results  Component Value Date   HGBA1C 5.3 06/05/2018   Lab Results  Component Value Date   INSULIN 1.2 (L) 06/05/2018   CBC    Component Value Date/Time   WBC 5.4 09/06/2018 0816   RBC 4.61 09/06/2018 0816   HGB 14.3 09/06/2018 0816   HCT 42.7 09/06/2018 0816   PLT 289.0 09/06/2018 0816   MCV 92.6 09/06/2018 0816   MCH 29.9 08/23/2017 0515   MCHC 33.4 09/06/2018 0816   RDW 15.3 09/06/2018 0816   LYMPHSABS 1.5 09/06/2018 0816   MONOABS 0.4 09/06/2018 0816   EOSABS 0.1 09/06/2018 0816   BASOSABS 0.1 09/06/2018 0816   Iron/TIBC/Ferritin/ %Sat No results found for: IRON, TIBC, FERRITIN, IRONPCTSAT Lipid Panel     Component Value Date/Time   CHOL 129 06/05/2018 1017   TRIG 37 06/05/2018 1017   HDL 63 06/05/2018 1017   CHOLHDL 2.0 06/05/2018 1017   LDLCALC 59 06/05/2018 1017   Hepatic Function Panel     Component Value  Date/Time   PROT 7.4 12/20/2018 1633   ALBUMIN 4.3 12/20/2018 1633   AST 16 12/20/2018 1633   ALT 14 12/20/2018 1633   ALKPHOS 68 12/20/2018 1633   BILITOT 0.3 12/20/2018 1633      Component Value Date/Time   TSH 0.643 01/16/2019 1551   TSH 0.508 01/10/2019 1547   TSH 0.331 (L) 12/20/2018 1633   Results for RASHIA, MCKESSON (MRN [id number]) as of 02/07/2019 11:49  Ref. Range 12/20/2018 16:33  Vitamin D, 25-Hydroxy Latest Ref Range: 30.0 - 100.0 ng/mL 42.6   I, Michaelene Song, am acting as Location manager for [employer], DO  I have reviewed the above documentation for accuracy and completeness, and I agree with the above. -Jearld Lesch, DO

## 2019-02-20 ENCOUNTER — Encounter (INDEPENDENT_AMBULATORY_CARE_PROVIDER_SITE_OTHER): Payer: Self-pay | Admitting: Bariatrics

## 2019-02-20 ENCOUNTER — Other Ambulatory Visit: Payer: Self-pay

## 2019-02-20 ENCOUNTER — Telehealth (INDEPENDENT_AMBULATORY_CARE_PROVIDER_SITE_OTHER): Payer: Managed Care, Other (non HMO) | Admitting: Bariatrics

## 2019-02-20 DIAGNOSIS — F3289 Other specified depressive episodes: Secondary | ICD-10-CM

## 2019-02-20 DIAGNOSIS — E559 Vitamin D deficiency, unspecified: Secondary | ICD-10-CM

## 2019-02-20 DIAGNOSIS — E669 Obesity, unspecified: Secondary | ICD-10-CM | POA: Diagnosis not present

## 2019-02-20 DIAGNOSIS — Z6834 Body mass index (BMI) 34.0-34.9, adult: Secondary | ICD-10-CM | POA: Diagnosis not present

## 2019-02-20 MED ORDER — DESVENLAFAXINE SUCCINATE ER 50 MG PO TB24: 50.0000 mg | ORAL_TABLET | Freq: Every day | ORAL | 0 refills | Status: DC

## 2019-02-20 NOTE — Progress Notes (Signed)
Office: [phone]  /  Fax: [phone] TeleHealth Visit:  Faith Hanson has verbally consented to this TeleHealth visit today. The patient is located at home, the provider is located at the [facility] office. The participants in this visit include the listed provider and patient. The visit was conducted today via FaceTime.  HPI:   Chief Complaint: OBESITY Faith Hanson is here to discuss her progress with her obesity treatment plan. She is on the Category 2 plan and is following her eating plan approximately 90% of the time. She states she is exercising 0 minutes 0 times per week. Faith Hanson states that she has lost 3 lbs (weight 211) and reports focusing on the protein and vegetables. She states she is drinking water. We were unable to weigh the patient today for this TeleHealth visit. She feels as if she has lost 3 lbs since her last visit. She has lost 11 lbs since starting treatment with us.  Depression with emotional eating behaviors Faith Hanson is struggling with emotional eating and using food for comfort to the extent that it is negatively impacting her health. She often snacks when she is not hungry. Faith Hanson sometimes feels she is out of control and then feels guilty that she made poor food choices. She has been working on behavior modification techniques to help reduce her emotional eating and has been somewhat successful. Faith Hanson is taking Lexapro but wants something else. She shows no sign of suicidal or homicidal ideations.  Depression screen PHQ 2/9 06/05/2018  Decreased Interest 1  Down, Depressed, Hopeless 2  PHQ - 2 Score 3  Altered sleeping 0  Tired, decreased energy 3  Change in appetite 1  Feeling bad or failure about yourself  1  Trouble concentrating 1  Moving slowly or fidgety/restless 0  Suicidal thoughts 0  PHQ-9 Score 9  Difficult doing work/chores Not difficult at all   Vitamin D deficiency Faith Hanson has a diagnosis of Vitamin D deficiency. Her  last Vitamin D was 42.6 on 12/20/2018. She is currently taking Vit D and denies nausea, vomiting or muscle weakness.  ASSESSMENT AND PLAN:  Other depression  Vitamin D deficiency  Class 1 obesity with serious comorbidity and body mass index (BMI) of 34.0 to 34.9 in adult, unspecified obesity type  PLAN:  Depression with Emotional Eating Behaviors We discussed behavior modification techniques today to help Faith Hanson deal with her emotional eating and depression. [name] will stop Lexapro and will start Pristiq 50 mg 1 PO QD #30 with 0 refills. She agrees to follow-up with our clinic in 2 weeks.  Vitamin D Deficiency Faith Hanson was informed that low Vitamin D levels contributes to fatigue and are associated with obesity, breast, and colon cancer. She agrees to continue taking Vit D and will follow-up for routine testing of Vitamin D, at least 2-3 times per year. She was informed of the risk of over-replacement of Vitamin D and agrees to not increase her dose unless she discusses this with us first. Faith Hanson agrees to follow-up with our clinic in 2 weeks.  Obesity [name] is currently in the action stage of change. As such, her goal is to continue with weight loss efforts. She has agreed to follow the Category 2 plan. [name] will work on [employer]meal planning and intentional eating. Faith Hanson has been instructed to work up to a goal of 150 minutes of combined cardio and strengthening exercise per week for weight loss and overall health benefits. We discussed the following Behavioral Modification Strategies today: increasing lean  protein intake, decreasing simple carbohydrates, increasing vegetables, increase H20 intake, decrease eating out, no skipping meals, work on meal planning and easy cooking plans, and keeping healthy foods in the home.   Faith Hanson has agreed to follow-up with our clinic in 2 weeks. She was informed of the importance of frequent follow-up visits to maximize her success with intensive  lifestyle modifications for her multiple health conditions.  ALLERGIES: No Known Allergies  MEDICATIONS: Current Outpatient Medications on File Prior to Visit  Medication Sig Dispense Refill  . levonorgestrel (MIRENA) 20 MCG/24HR IUD 1 each by Intrauterine route once.    . Vitamin D, Ergocalciferol, (DRISDOL) 1.25 MG (50000 UT) CAPS capsule Take 1 capsule (50,000 Units total) by mouth every 14 (fourteen) days. 8 capsule 0   No current facility-administered medications on file prior to visit.     PAST MEDICAL HISTORY: Past Medical History:  Diagnosis Date  . Medical history non-contributory   . Vaginal Pap smear, abnormal     PAST SURGICAL HISTORY: Past Surgical History:  Procedure Laterality Date  . CESAREAN SECTION    . LEEP      SOCIAL HISTORY: Social History   Tobacco Use  . Smoking status: Never Smoker  . Smokeless tobacco: Never Used  Substance Use Topics  . Alcohol use: Yes    Comment: rarely   . Drug use: No    FAMILY HISTORY: Family History  Problem Relation Age of Onset  . Diabetes Maternal Grandmother   . Hyperlipidemia Mother   . Obesity Mother   . Anemia Sister   . Thyroid disease Sister   . Colon cancer Maternal Aunt    ROS: Review of Systems  Gastrointestinal: Negative for nausea and vomiting.  Musculoskeletal:       Negative for muscle weakness.  Psychiatric/Behavioral: Positive for depression (emotional eating). Negative for suicidal ideas.       Negative for homicidal ideas.   PHYSICAL EXAM: Pt in no acute distress  RECENT LABS AND TESTS: BMET    Component Value Date/Time   NA 140 12/20/2018 1633   K 4.5 12/20/2018 1633   CL 101 12/20/2018 1633   CO2 24 12/20/2018 1633   GLUCOSE 87 12/20/2018 1633   GLUCOSE 83 09/06/2018 0816   BUN 21 (H) 12/20/2018 1633   CREATININE 0.91 12/20/2018 1633   CALCIUM 9.4 12/20/2018 1633   GFRNONAA 83 12/20/2018 1633   GFRAA 95 12/20/2018 1633   Lab Results  Component Value Date   HGBA1C 5.3  06/05/2018   Lab Results  Component Value Date   INSULIN 1.2 (L) 06/05/2018   CBC    Component Value Date/Time   WBC 5.4 09/06/2018 0816   RBC 4.61 09/06/2018 0816   HGB 14.3 09/06/2018 0816   HCT 42.7 09/06/2018 0816   PLT 289.0 09/06/2018 0816   MCV 92.6 09/06/2018 0816   MCH 29.9 08/23/2017 0515   MCHC 33.4 09/06/2018 0816   RDW 15.3 09/06/2018 0816   LYMPHSABS 1.5 09/06/2018 0816   MONOABS 0.4 09/06/2018 0816   EOSABS 0.1 09/06/2018 0816   BASOSABS 0.1 09/06/2018 0816   Iron/TIBC/Ferritin/ %Sat No results found for: IRON, TIBC, FERRITIN, IRONPCTSAT Lipid Panel     Component Value Date/Time   CHOL 129 06/05/2018 1017   TRIG 37 06/05/2018 1017   HDL 63 06/05/2018 1017   CHOLHDL 2.0 06/05/2018 1017   LDLCALC 59 06/05/2018 1017   Hepatic Function Panel     Component Value Date/Time   PROT 7.4 12/20/2018 1633  ALBUMIN 4.3 12/20/2018 1633   AST 16 12/20/2018 1633   ALT 14 12/20/2018 1633   ALKPHOS 68 12/20/2018 1633   BILITOT 0.3 12/20/2018 1633      Component Value Date/Time   TSH 0.643 01/16/2019 1551   TSH 0.508 01/10/2019 1547   TSH 0.331 (L) 12/20/2018 1633   Results for GENELL, THEDE (MRN [id number]) as of 02/20/2019 10:42  Ref. Range 12/20/2018 16:33  Vitamin D, 25-Hydroxy Latest Ref Range: 30.0 - 100.0 ng/mL 42.6   I, Michaelene Song, am acting as Location manager for [employer], DO  I have reviewed the above documentation for accuracy and completeness, and I agree with the above. -Jearld Lesch, DO

## 2019-03-06 ENCOUNTER — Telehealth (INDEPENDENT_AMBULATORY_CARE_PROVIDER_SITE_OTHER): Payer: Managed Care, Other (non HMO) | Admitting: Bariatrics

## 2019-03-06 ENCOUNTER — Other Ambulatory Visit: Payer: Self-pay

## 2019-03-06 ENCOUNTER — Encounter (INDEPENDENT_AMBULATORY_CARE_PROVIDER_SITE_OTHER): Payer: Self-pay | Admitting: Bariatrics

## 2019-03-06 DIAGNOSIS — E559 Vitamin D deficiency, unspecified: Secondary | ICD-10-CM

## 2019-03-06 DIAGNOSIS — Z6834 Body mass index (BMI) 34.0-34.9, adult: Secondary | ICD-10-CM | POA: Diagnosis not present

## 2019-03-06 DIAGNOSIS — F3289 Other specified depressive episodes: Secondary | ICD-10-CM

## 2019-03-06 DIAGNOSIS — E669 Obesity, unspecified: Secondary | ICD-10-CM

## 2019-03-06 MED ORDER — DESVENLAFAXINE SUCCINATE ER 50 MG PO TB24: 50.0000 mg | ORAL_TABLET | Freq: Every day | ORAL | 0 refills | Status: DC

## 2019-03-07 NOTE — Progress Notes (Signed)
Office: [phone]  /  Fax: [phone] TeleHealth Visit:  Faith Hanson has verbally consented to this TeleHealth visit today. The patient is located at home, the provider is located at the [facility] office. The participants in this visit include the listed provider and patient. The visit was conducted today via FaceTime.  HPI:   Chief Complaint: OBESITY Faith Hanson is here to discuss her progress with her obesity treatment plan. She is on the Category 2 plan and is following her eating plan approximately 90% of the time. She states she is exercising 0 minutes 0 times per week. Faith Hanson states that her weight is the same (211 lbs) and reports doing well with her water intake. We were unable to weigh the patient today for this TeleHealth visit. She feels as if she has maintained her weight since her last visit. She has lost 11 lbs since starting treatment with us.  Vitamin D deficiency Faith Hanson has a diagnosis of Vitamin D deficiency. Last Vitamin D was 42.6 on 12/20/2018. She is currently taking prescription Vit D and denies nausea, vomiting or muscle weakness.  Depression, Other Faith Hanson is struggling with emotional eating and using food for comfort to the extent that it is negatively impacting her health. She often snacks when she is not hungry. Faith Hanson sometimes feels she is out of control and then feels guilty that she made poor food choices. She has been working on behavior modification techniques to help reduce her emotional eating and has been somewhat successful. She shows no sign of suicidal or homicidal ideations.  Depression screen PHQ 2/9 06/05/2018  Decreased Interest 1  Down, Depressed, Hopeless 2  PHQ - 2 Score 3  Altered sleeping 0  Tired, decreased energy 3  Change in appetite 1  Feeling bad or failure about yourself  1  Trouble concentrating 1  Moving slowly or fidgety/restless 0  Suicidal thoughts 0  PHQ-9 Score 9  Difficult doing work/chores Not  difficult at all   ASSESSMENT AND PLAN:  Vitamin D deficiency  Other depression  Class 1 obesity with serious comorbidity and body mass index (BMI) of 34.0 to 34.9 in adult, unspecified obesity type  PLAN:  Vitamin D Deficiency Faith Hanson was informed that low Vitamin D levels contributes to fatigue and are associated with obesity, breast, and colon cancer. She agrees to continue to take prescription Vit D (has prescription) and will follow-up for routine testing of Vitamin D, at least 2-3 times per year. She was informed of the risk of over-replacement of Vitamin D and agrees to not increase her dose unless she discusses this with us first. Faith Hanson agrees to follow-up with our clinic in 4 weeks.  Depression with Emotional Eating Behaviors We discussed behavior modification techniques today to help Faith Hanson deal with her emotional eating and depression. Faith Hanson was given a prescription for Pristiq 50 mg 1 PO daily #30 with 0 refills and agrees to follow-up with our clinic in 4 weeks.  Obesity Faith Hanson is currently in the action stage of change. As such, her goal is to continue with weight loss efforts. She has agreed to follow the Category 2 plan. Faith Hanson will work on Faith Hanson Waste Industriesmeal planning and intentional eating. Faith Hanson has been instructed to work up to a goal of 150 minutes of combined cardio and strengthening exercise per week for weight loss and overall health benefits. We discussed the following Behavioral Modification Strategies today: increasing lean protein intake, decreasing simple carbohydrates, increasing vegetables, increase H20 intake, decrease eating out, no  skipping meals, work on meal planning and easy cooking plans, keeping healthy foods in the home, better snacking choices, and planning for success.  Faith Hanson has agreed to follow-up with our clinic in 4 weeks. She was informed of the importance of frequent follow-up visits to maximize her success with intensive lifestyle modifications  for her multiple health conditions.  ALLERGIES: No Known Allergies  MEDICATIONS: Current Outpatient Medications on File Prior to Visit  Medication Sig Dispense Refill  . levonorgestrel (MIRENA) 20 MCG/24HR IUD 1 each by Intrauterine route once.    . Vitamin D, Ergocalciferol, (DRISDOL) 1.25 MG (50000 UT) CAPS capsule Take 1 capsule (50,000 Units total) by mouth every 14 (fourteen) days. 8 capsule 0   No current facility-administered medications on file prior to visit.     PAST MEDICAL HISTORY: Past Medical History:  Diagnosis Date  . Medical history non-contributory   . Vaginal Pap smear, abnormal     PAST SURGICAL HISTORY: Past Surgical History:  Procedure Laterality Date  . CESAREAN SECTION    . LEEP      SOCIAL HISTORY: Social History   Tobacco Use  . Smoking status: Never Smoker  . Smokeless tobacco: Never Used  Substance Use Topics  . Alcohol use: Yes    Comment: rarely   . Drug use: No    FAMILY HISTORY: Family History  Problem Relation Age of Onset  . Diabetes Maternal Grandmother   . Hyperlipidemia Mother   . Obesity Mother   . Anemia Sister   . Thyroid disease Sister   . Colon cancer Maternal Aunt    ROS: Review of Systems  Gastrointestinal: Negative for nausea and vomiting.  Musculoskeletal:       Negative for muscle weakness.  Psychiatric/Behavioral: Positive for depression. Negative for suicidal ideas.       Negative for homicidal ideas.   PHYSICAL EXAM: Pt in no acute distress  RECENT LABS AND TESTS: BMET    Component Value Date/Time   NA 140 12/20/2018 1633   K 4.5 12/20/2018 1633   CL 101 12/20/2018 1633   CO2 24 12/20/2018 1633   GLUCOSE 87 12/20/2018 1633   GLUCOSE 83 09/06/2018 0816   BUN 21 (H) 12/20/2018 1633   CREATININE 0.91 12/20/2018 1633   CALCIUM 9.4 12/20/2018 1633   GFRNONAA 83 12/20/2018 1633   GFRAA 95 12/20/2018 1633   Lab Results  Component Value Date   HGBA1C 5.3 06/05/2018   Lab Results  Component  Value Date   INSULIN 1.2 (L) 06/05/2018   CBC    Component Value Date/Time   WBC 5.4 09/06/2018 0816   RBC 4.61 09/06/2018 0816   HGB 14.3 09/06/2018 0816   HCT 42.7 09/06/2018 0816   PLT 289.0 09/06/2018 0816   MCV 92.6 09/06/2018 0816   MCH 29.9 08/23/2017 0515   MCHC 33.4 09/06/2018 0816   RDW 15.3 09/06/2018 0816   LYMPHSABS 1.5 09/06/2018 0816   MONOABS 0.4 09/06/2018 0816   EOSABS 0.1 09/06/2018 0816   BASOSABS 0.1 09/06/2018 0816   Iron/TIBC/Ferritin/ %Sat No results found for: IRON, TIBC, FERRITIN, IRONPCTSAT Lipid Panel     Component Value Date/Time   CHOL 129 06/05/2018 1017   TRIG 37 06/05/2018 1017   HDL 63 06/05/2018 1017   CHOLHDL 2.0 06/05/2018 1017   LDLCALC 59 06/05/2018 1017   Hepatic Function Panel     Component Value Date/Time   PROT 7.4 12/20/2018 1633   ALBUMIN 4.3 12/20/2018 1633   AST 16 12/20/2018 1633  ALT 14 12/20/2018 1633   ALKPHOS 68 12/20/2018 1633   BILITOT 0.3 12/20/2018 1633      Component Value Date/Time   TSH 0.643 01/16/2019 1551   TSH 0.508 01/10/2019 1547   TSH 0.331 (L) 12/20/2018 1633   Results for ANUSHKA, HARTINGER (MRN [id number]) as of 03/07/2019 10:41  Ref. Range 12/20/2018 16:33  Vitamin D, 25-Hydroxy Latest Ref Range: 30.0 - 100.0 ng/mL 42.6   I, Michaelene Song, am acting as Location manager for [employer], DO  I have reviewed the above documentation for accuracy and completeness, and I agree with the above. -Jearld Lesch, DO

## 2019-04-03 ENCOUNTER — Other Ambulatory Visit: Payer: Self-pay

## 2019-04-05 ENCOUNTER — Encounter: Payer: Self-pay | Admitting: Internal Medicine

## 2019-04-05 ENCOUNTER — Ambulatory Visit (INDEPENDENT_AMBULATORY_CARE_PROVIDER_SITE_OTHER): Payer: Managed Care, Other (non HMO) | Admitting: Internal Medicine

## 2019-04-05 ENCOUNTER — Other Ambulatory Visit: Payer: Self-pay

## 2019-04-05 VITALS — BP 118/78 | HR 87 | Temp 98.3°F | Ht 67.0 in | Wt 215.6 lb

## 2019-04-05 DIAGNOSIS — R899 Unspecified abnormal finding in specimens from other organs, systems and tissues: Secondary | ICD-10-CM

## 2019-04-05 NOTE — Progress Notes (Signed)
Name: Faith Hanson  MRN/ DOB: [id number], 09/12/1985      Age/ Sex: 33 y.o., female     PCP: Verdene Hanson, Angela, MD   Reason for Endocrinology Evaluation: Hyperthyroidism     Initial Endocrinology Clinic Visit: 09/06/2018    PATIENT IDENTIFIER: [name] is a 33 y.o., female with a past medical history of obesity . She has followed with [facility] since 09/06/2018 for consultative assistance with management of her hyperthyroidism.   HISTORICAL SUMMARY: The patient was first diagnosed with hyperthyroidism through the wellness clinic when she was noted to have abnormal TFT's on routine work up with a TSH of 0.400 uIU/mL with high FT4 3.31 ng/dL and elevated T3 at 132280 ng/dL.   She is s/p delivery of a baby boy 08/2017   Pt continued with alternating abnormal and normal labs based on the lab used. We eventually [place] blood samples [place] as her TFT's were inconsistent with her clinical presentation, her TFT's were normal, consistent with antibody interference.   SUBJECTIVE:   During last visit (01/03/19): Clinically euthyroid, [facility]'s [facility]  Today (04/05/2019):  Faith Hanson is here for a follow up on abnormal thyroid function.   Today she endorses stable weight.  Denies heat intolerance, diarrhea or palpitations.  Denies shaking or jittery sensation  Denies local neck symptoms   Sister with hyperthyroidism    ROS:  As per HPI.   HISTORY:  Past Medical History:  Past Medical History:  Diagnosis Date  . Medical history non-contributory   . Vaginal Pap smear, abnormal    Past Surgical History:  Past Surgical History:  Procedure Laterality Date  . CESAREAN SECTION    . LEEP      Social History:  reports that she has never smoked. She has never used smokeless tobacco. She reports current alcohol use. She reports that she does not use drugs. Family History:  Family History  Problem Relation Age of  Onset  . Diabetes Maternal Grandmother   . Hyperlipidemia Mother   . Obesity Mother   . Anemia Sister   . Thyroid disease Sister   . Colon cancer Maternal Aunt      HOME MEDICATIONS: Allergies as of 04/05/2019   No Known Allergies     Medication List       Accurate as of April 05, 2019  7:27 AM. If you have any questions, ask your nurse or doctor.        desvenlafaxine 50 MG 24 hr tablet Commonly known as: Pristiq Take 1 tablet (50 mg total) by mouth daily.   levonorgestrel 20 MCG/24HR IUD Commonly known as: MIRENA 1 each by Intrauterine route once.   Vitamin D (Ergocalciferol) 1.25 MG (50000 UT) Caps capsule Commonly known as: DRISDOL Take 1 capsule (50,000 Units total) by mouth every 14 (fourteen) days.       DATA REVIEWED:  Results for Faith Hanson, Faith Hanson (MRN [id number]) as of 04/05/2019 09:11  Ref. Range 01/16/2019 00:00  TSH Latest Units: uU/mL 1.3  Triiodothyronine (T-3), Serum Latest Units: ng/dL 86  Free Thyroxine Latest Units: ng/dL 3.661.31    Results for Faith Hanson, Faith Hanson (MRN [id number]) as of 10/13/2018 16:21  Ref. Range 09/06/2018 08:16  TRAB Latest Ref Range: <=2.00 IU/L <1.00     ASSESSMENT / PLAN / RECOMMENDATIONS:   1. Abnormal Thyroid function test   - Clinically she is euthyroid  - No local neck symptoms - TRAb has  been negative in the past  - Pt has lab interference with [facility] , but has been normal with [facility] and [facility]    - In the future I suggest sending her labs to [place] or [facility] if needed.  - She is at risk for developing thyroid disease based on her family history but not at this time.      F/U PRN      Signed electronically by: Mack Guise, MD  [facility]  [address] [place], [place] [address] Phone: [phone] FAX: [phone]      CC: Tally Due, MD No address on file Phone: None  Fax: [phone]   Return to Endocrinology clinic  as below: Future Appointments  Date Time Provider [place]  04/05/2019  8:10 AM Shamleffer, Melanie Crazier, MD [facility] None

## 2020-05-12 ENCOUNTER — Ambulatory Visit (INDEPENDENT_AMBULATORY_CARE_PROVIDER_SITE_OTHER): Payer: Managed Care, Other (non HMO) | Admitting: Family Medicine

## 2020-05-26 ENCOUNTER — Ambulatory Visit (INDEPENDENT_AMBULATORY_CARE_PROVIDER_SITE_OTHER): Payer: Managed Care, Other (non HMO) | Admitting: Family Medicine

## 2020-08-27 ENCOUNTER — Ambulatory Visit: Payer: Managed Care, Other (non HMO) | Admitting: Family Medicine

## 2020-10-01 ENCOUNTER — Other Ambulatory Visit: Payer: Self-pay

## 2020-10-01 ENCOUNTER — Encounter: Payer: Self-pay | Admitting: Physician Assistant

## 2020-10-01 ENCOUNTER — Ambulatory Visit (INDEPENDENT_AMBULATORY_CARE_PROVIDER_SITE_OTHER): Payer: Managed Care, Other (non HMO) | Admitting: Physician Assistant

## 2020-10-01 VITALS — BP 114/71 | HR 84 | Temp 98.0°F | Ht 67.0 in | Wt 235.0 lb

## 2020-10-01 DIAGNOSIS — E669 Obesity, unspecified: Secondary | ICD-10-CM | POA: Diagnosis not present

## 2020-10-01 DIAGNOSIS — Z13220 Encounter for screening for lipoid disorders: Secondary | ICD-10-CM

## 2020-10-01 DIAGNOSIS — Z0000 Encounter for general adult medical examination without abnormal findings: Secondary | ICD-10-CM

## 2020-10-01 DIAGNOSIS — Z6836 Body mass index (BMI) 36.0-36.9, adult: Secondary | ICD-10-CM

## 2020-10-01 DIAGNOSIS — Z131 Encounter for screening for diabetes mellitus: Secondary | ICD-10-CM | POA: Diagnosis not present

## 2020-10-01 LAB — COMPREHENSIVE METABOLIC PANEL
ALT: 12 U/L (ref 0–35)
AST: 13 U/L (ref 0–37)
Albumin: 4 g/dL (ref 3.5–5.2)
Alkaline Phosphatase: 61 U/L (ref 39–117)
BUN: 16 mg/dL (ref 6–23)
CO2: 30 mEq/L (ref 19–32)
Calcium: 9.4 mg/dL (ref 8.4–10.5)
Chloride: 102 mEq/L (ref 96–112)
Creatinine, Ser: 0.9 mg/dL (ref 0.40–1.20)
GFR: 82.09 mL/min (ref >60.00)
Glucose, Bld: 72 mg/dL (ref 70–99)
Potassium: 4.5 mEq/L (ref 3.5–5.1)
Sodium: 137 mEq/L (ref 135–145)
Total Bilirubin: 0.5 mg/dL (ref 0.2–1.2)
Total Protein: 7.4 g/dL (ref 6.0–8.3)

## 2020-10-01 LAB — CBC WITH DIFFERENTIAL/PLATELET
Basophils Absolute: 0.1 10*3/uL (ref 0.0–0.1)
Basophils Relative: 1 % (ref 0.0–3.0)
Eosinophils Absolute: 0.1 10*3/uL (ref 0.0–0.7)
Eosinophils Relative: 1.5 % (ref 0.0–5.0)
HCT: 38.7 % (ref 36.0–46.0)
Hemoglobin: 13.1 g/dL (ref 12.0–15.0)
Lymphocytes Relative: 28.3 % (ref 12.0–46.0)
Lymphs Abs: 1.7 10*3/uL (ref 0.7–4.0)
MCHC: 33.8 g/dL (ref 30.0–36.0)
MCV: 93.1 fl (ref 78.0–100.0)
Monocytes Absolute: 0.4 10*3/uL (ref 0.1–1.0)
Monocytes Relative: 6.8 % (ref 3.0–12.0)
Neutro Abs: 3.7 10*3/uL (ref 1.4–7.7)
Neutrophils Relative %: 62.4 % (ref 43.0–77.0)
Platelets: 274 10*3/uL (ref 150.0–400.0)
RBC: 4.16 Mil/uL (ref 3.87–5.11)
RDW: 15.3 % (ref 11.5–15.5)
WBC: 6 10*3/uL (ref 4.0–10.5)

## 2020-10-01 LAB — LIPID PANEL
Cholesterol: 126 mg/dL (ref 0–200)
HDL: 52.4 mg/dL (ref >39.00)
LDL Cholesterol: 61 mg/dL (ref 0–99)
NonHDL: 74.06
Total CHOL/HDL Ratio: 2
Triglycerides: 66 mg/dL (ref 0.0–149.0)
VLDL: 13.2 mg/dL (ref 0.0–40.0)

## 2020-10-01 NOTE — Patient Instructions (Signed)
Great to meet you today! Please go to the lab and I will call with results or send through [place]. Keep up good work with planning for healthy lifestyle this year!  Schedule with GYN for annual female care. Call if any concerns! See you back in one year for CPE.    Preventive Care 66-37 Years Old, Female Preventive care refers to lifestyle choices and visits with your health care provider that can promote health and wellness. This includes:  A yearly physical exam. This is also called an annual wellness visit.  Regular dental and eye exams.  Immunizations.  Screening for certain conditions.  Healthy lifestyle choices, such as: ? Eating a healthy diet. ? Getting regular exercise. ? Not using drugs or products that contain nicotine and tobacco. ? Limiting alcohol use. What can I expect for my preventive care visit? Physical exam Your health care provider may check your:  Height and weight. These may be used to calculate your BMI (body mass index). BMI is a measurement that tells if you are at a healthy weight.  Heart rate and blood pressure.  Body temperature.  Skin for abnormal spots. Counseling Your health care provider may ask you questions about your:  Past medical problems.  Family's medical history.  Alcohol, tobacco, and drug use.  Emotional well-being.  Home life and relationship well-being.  Sexual activity.  Diet, exercise, and sleep habits.  Work and work Statistician.  Access to firearms.  Method of birth control.  Menstrual cycle.  Pregnancy history. What immunizations do I need? Vaccines are usually given at various ages, according to a schedule. Your health care provider will recommend vaccines for you based on your age, medical history, and lifestyle or other factors, such as travel or where you work.   What tests do I need? Blood tests  Lipid and cholesterol levels. These may be checked every 5 years starting at age 38.  Hepatitis C  test.  Hepatitis B test. Screening  Diabetes screening. This is done by checking your blood sugar (glucose) after you have not eaten for a while (fasting).  STD (sexually transmitted disease) testing, if you are at risk.  BRCA-related cancer screening. This may be done if you have a family history of breast, ovarian, tubal, or peritoneal cancers.  Pelvic exam and Pap test. This may be done every 3 years starting at age 98. Starting at age 14, this may be done every 5 years if you have a Pap test in combination with an HPV test. Talk with your health care provider about your test results, treatment options, and if necessary, the need for more tests.   Follow these instructions at home: Eating and drinking  Eat a healthy diet that includes fresh fruits and vegetables, whole grains, lean protein, and low-fat dairy products.  Take vitamin and mineral supplements as recommended by your health care provider.  Do not drink alcohol if: ? Your health care provider tells you not to drink. ? You are pregnant, may be pregnant, or are planning to become pregnant.  If you drink alcohol: ? Limit how much you have to 0-1 drink a day. ? Be aware of how much alcohol is in your drink. In the [place] one drink equals one 12 oz bottle of beer (355 mL), one 5 oz glass of wine (148 mL), or one 1 oz glass of hard liquor (44 mL).   Lifestyle  Take daily care of your teeth and gums. Brush your teeth every morning and night  with fluoride toothpaste. Floss one time each day.  Stay active. Exercise for at least 30 minutes 5 or more days each week.  Do not use any products that contain nicotine or tobacco, such as cigarettes, e-cigarettes, and chewing tobacco. If you need help quitting, ask your health care provider.  Do not use drugs.  If you are sexually active, practice safe sex. Use a condom or other form of protection to prevent STIs (sexually transmitted infections).  If you do not wish to become  pregnant, use a form of birth control. If you plan to become pregnant, see your health care provider for a prepregnancy visit.  Find healthy ways to cope with stress, such as: ? Meditation, yoga, or listening to music. ? Journaling. ? Talking to a trusted person. ? Spending time with friends and family. Safety  Always wear your seat belt while driving or riding in a vehicle.  Do not drive: ? If you have been drinking alcohol. Do not ride with someone who has been drinking. ? When you are tired or distracted. ? While texting.  Wear a helmet and other protective equipment during sports activities.  If you have firearms in your house, make sure you follow all gun safety procedures.  Seek help if you have been physically or sexually abused. What's next?  Go to your health care provider once a year for an annual wellness visit.  Ask your health care provider how often you should have your eyes and teeth checked.  Stay up to date on all vaccines. This information is not intended to replace advice given to you by your health care provider. Make sure you discuss any questions you have with your health care provider. Document Revised: 03/02/2020 Document Reviewed: 03/16/2018 Elsevier Patient Education  2021 [employer].

## 2020-10-01 NOTE — Progress Notes (Signed)
New Patient Office Visit  Subjective:  Patient ID: Faith Hanson, female    DOB: [birth year]  Age: 35 y.o. MRN: [id number]   HPI Faith Hanson presents for new patient establishment and annual physical. Married. Works at home for [employer] 41 yo daughter and 42 yo son. Overall happy with life, no major health issues. [facility] to [facility] and was happy with progress there.   Acute concerns: None  Health maintenance: Lifestyle/ exercise: Motivated to get started on about 20 minutes /day  Nutrition: Home cooked meals Mental health: Feels on edge some days, tried Lexapro and ?Prozac after son was born, but felt like a zombie on those; unsure if she wants to try anything else at this time Caffeine: 1 cup of coffee in the morning Sleep: Averages 8 hours per night, sleeps well overall Substance use: None Sexual activity: Married, monogamous Immunizations: Needs COVID-19, UTD on Tdap 2019, occasionally does flu shots Colonoscopy: She said she has had one previously in her early 65s for GI discomfort and it was normal; maternal aunt had colon cancer  Pap: [facility] - she needs to call and set this up   Past Medical History:  Diagnosis Date  . Medical history non-contributory   . Vaginal Pap smear, abnormal     Past Surgical History:  Procedure Laterality Date  . CESAREAN SECTION    . LEEP      Family History  Problem Relation Age of Onset  . Diabetes Maternal Grandmother   . Hyperlipidemia Mother   . Obesity Mother   . Anemia Sister   . Thyroid disease Sister   . Colon cancer Maternal Aunt     Social History   Socioeconomic History  . Marital status: Married    Spouse name: Talon  . Number of children: Not on file  . Years of education: Not on file  . Highest education level: Not on file  Occupational History  . 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 Smoking status: Never Smoker  . Smokeless  tobacco: Never Used  Substance and Sexual Activity  . Alcohol use: Yes    Comment: rarely   . Drug use: No  . Sexual activity: Yes    Birth control/protection: None  Other Topics Concern  . Not on file  Social History Narrative  . Not on file   Social Determinants of Health   Financial Resource Strain: Not on file  Food Insecurity: Not on file  Transportation Needs: Not on file  Physical Activity: Not on file  Stress: Not on file  Social Connections: Not on file  Intimate Partner Violence: Not on file    ROS Review of Systems  Constitutional: Negative for activity change, appetite change, fever and unexpected weight change.  HENT: Negative for congestion.   Eyes: Negative for visual disturbance.  Respiratory: Negative for apnea, cough and shortness of breath.   Cardiovascular: Negative for chest pain, palpitations and leg swelling.  Gastrointestinal: Negative for abdominal pain, blood in stool, constipation and diarrhea.  Endocrine: Negative for polydipsia, polyphagia and polyuria.  Genitourinary: Negative for dysuria and pelvic pain.  Musculoskeletal: Negative for arthralgias.  Skin: Negative for rash.  Neurological: Negative for dizziness, weakness and headaches.  Hematological: Negative for adenopathy. Does not bruise/bleed easily.  Psychiatric/Behavioral: Negative for sleep disturbance and suicidal ideas. The patient is not nervous/anxious.        Says she stays on edge some days  Objective:   Today's Vitals: BP 114/71   Pulse 84   Temp 98 F (36.7 C)   Ht 5\' 7"  (1.702 m)   Wt 235 lb (106.6 kg)   SpO2 98%   BMI 36.81 kg/m   Physical Exam Vitals and nursing note reviewed.  Constitutional:      Appearance: Normal appearance. She is normal weight. She is not toxic-appearing.  HENT:     Head: Normocephalic and atraumatic.     Right Ear: Tympanic membrane, ear canal and external ear normal.     Left Ear: Tympanic membrane, ear canal and external ear  normal.     Nose: Nose normal.     Mouth/Throat:     Mouth: Mucous membranes are moist.  Eyes:     Extraocular Movements: Extraocular movements intact.     Conjunctiva/sclera: Conjunctivae normal.     Pupils: Pupils are equal, round, and reactive to light.  Cardiovascular:     Rate and Rhythm: Normal rate and regular rhythm.     Pulses: Normal pulses.     Heart sounds: Normal heart sounds.  Pulmonary:     Effort: Pulmonary effort is normal.     Breath sounds: Normal breath sounds.  Abdominal:     General: Abdomen is flat. Bowel sounds are normal.     Palpations: Abdomen is soft.  Musculoskeletal:        General: Normal range of motion.     Cervical back: Normal range of motion and neck supple.  Skin:    General: Skin is warm and dry.  Neurological:     General: No focal deficit present.     Mental Status: She is alert and oriented to person, place, and time.  Psychiatric:        Mood and Affect: Mood normal.        Behavior: Behavior normal.        Thought Content: Thought content normal.        Judgment: Judgment normal.     Assessment & Plan:   Problem List Items Addressed This Visit   None   Visit Diagnoses    Encounter for annual physical exam    -  Primary   Relevant Orders   CBC with Differential/Platelet   Comprehensive metabolic panel   Lipid panel   Diabetes mellitus screening       Relevant Orders   Comprehensive metabolic panel   Screening for cholesterol level       Relevant Orders   Lipid panel   Class 2 obesity without serious comorbidity with body mass index (BMI) of 36.0 to 36.9 in adult, unspecified obesity type          Outpatient Encounter Medications as of 10/01/2020  Medication Sig  . Ferrous Sulfate (IRON) 142 (45 Fe) MG TBCR Take by mouth.  . levonorgestrel (MIRENA) 20 MCG/24HR IUD 1 each by Intrauterine route once.  . Multiple Vitamin (MULTIVITAMIN) tablet Take 1 tablet by mouth daily.  . [DISCONTINUED] desvenlafaxine (PRISTIQ) 50 MG  24 hr tablet Take 1 tablet (50 mg total) by mouth daily.  . [DISCONTINUED] Vitamin D, Ergocalciferol, (DRISDOL) 1.25 MG (50000 UT) CAPS capsule Take 1 capsule (50,000 Units total) by mouth every 14 (fourteen) days.   No facility-administered encounter medications on file as of 10/01/2020.    Follow-up: No follow-ups on file.   1. Encounter for annual physical exam 2. Diabetes mellitus screening 3. Screening for cholesterol level 4. Class 2 obesity without serious comorbidity with body  mass index (BMI) of 36.0 to 36.9 in adult, unspecified obesity type Age-appropriate screening and counseling performed today. Will check labs and call with results. She is aiming for 20 minutes of physical activity daily and plans to meal-prep again. She will call if needing to get back in with [facility].  This visit occurred during the SARS-CoV-2 public health emergency.  Safety protocols were in place, including screening questions prior to the visit, additional usage of staff PPE, and extensive cleaning of exam room while observing appropriate contact time as indicated for disinfecting solutions.    Truth Barot M Aizah Gehlhausen, PA-C

## 2021-01-27 DIAGNOSIS — I83813 Varicose veins of bilateral lower extremities with pain: Secondary | ICD-10-CM | POA: Diagnosis not present

## 2021-03-31 DIAGNOSIS — I83811 Varicose veins of right lower extremities with pain: Secondary | ICD-10-CM | POA: Diagnosis not present

## 2021-04-10 DIAGNOSIS — I83811 Varicose veins of right lower extremities with pain: Secondary | ICD-10-CM | POA: Diagnosis not present

## 2021-04-14 DIAGNOSIS — I83811 Varicose veins of right lower extremities with pain: Secondary | ICD-10-CM | POA: Diagnosis not present

## 2021-04-16 DIAGNOSIS — Z9889 Other specified postprocedural states: Secondary | ICD-10-CM | POA: Insufficient documentation

## 2021-04-24 DIAGNOSIS — I83811 Varicose veins of right lower extremities with pain: Secondary | ICD-10-CM | POA: Diagnosis not present

## 2021-05-05 DIAGNOSIS — R600 Localized edema: Secondary | ICD-10-CM | POA: Diagnosis not present

## 2021-05-05 DIAGNOSIS — I83813 Varicose veins of bilateral lower extremities with pain: Secondary | ICD-10-CM | POA: Diagnosis not present

## 2021-06-02 DIAGNOSIS — I83813 Varicose veins of bilateral lower extremities with pain: Secondary | ICD-10-CM | POA: Diagnosis not present

## 2021-06-02 DIAGNOSIS — I83811 Varicose veins of right lower extremities with pain: Secondary | ICD-10-CM | POA: Diagnosis not present

## 2021-06-02 DIAGNOSIS — I781 Nevus, non-neoplastic: Secondary | ICD-10-CM | POA: Diagnosis not present

## 2021-06-23 DIAGNOSIS — I83812 Varicose veins of left lower extremities with pain: Secondary | ICD-10-CM | POA: Diagnosis not present

## 2021-08-10 DIAGNOSIS — I781 Nevus, non-neoplastic: Secondary | ICD-10-CM | POA: Diagnosis not present

## 2022-02-24 ENCOUNTER — Encounter (INDEPENDENT_AMBULATORY_CARE_PROVIDER_SITE_OTHER): Payer: Self-pay

## 2022-04-12 ENCOUNTER — Encounter: Payer: Self-pay | Admitting: *Deleted

## 2022-07-01 ENCOUNTER — Encounter: Payer: Self-pay | Admitting: *Deleted

## 2022-08-19 ENCOUNTER — Encounter: Payer: Medicaid Other | Admitting: Physician Assistant

## 2022-08-26 ENCOUNTER — Ambulatory Visit: Payer: Managed Care, Other (non HMO) | Admitting: Physician Assistant

## 2022-08-26 VITALS — BP 112/78 | HR 88 | Temp 97.7°F | Ht 67.0 in | Wt 233.8 lb

## 2022-08-26 DIAGNOSIS — F419 Anxiety disorder, unspecified: Secondary | ICD-10-CM | POA: Diagnosis not present

## 2022-08-26 DIAGNOSIS — R39198 Other difficulties with micturition: Secondary | ICD-10-CM | POA: Diagnosis not present

## 2022-08-26 DIAGNOSIS — E559 Vitamin D deficiency, unspecified: Secondary | ICD-10-CM | POA: Diagnosis not present

## 2022-08-26 DIAGNOSIS — Z0001 Encounter for general adult medical examination with abnormal findings: Secondary | ICD-10-CM | POA: Diagnosis not present

## 2022-08-26 DIAGNOSIS — Z0000 Encounter for general adult medical examination without abnormal findings: Secondary | ICD-10-CM

## 2022-08-26 DIAGNOSIS — Z6836 Body mass index (BMI) 36.0-36.9, adult: Secondary | ICD-10-CM | POA: Diagnosis not present

## 2022-08-26 DIAGNOSIS — F32A Depression, unspecified: Secondary | ICD-10-CM | POA: Diagnosis not present

## 2022-08-26 DIAGNOSIS — E6609 Other obesity due to excess calories: Secondary | ICD-10-CM | POA: Diagnosis not present

## 2022-08-26 LAB — LIPID PANEL
Cholesterol: 147 mg/dL (ref 0–200)
HDL: 54.8 mg/dL (ref >39.00)
LDL Cholesterol: 73 mg/dL (ref 0–99)
NonHDL: 92.21
Total CHOL/HDL Ratio: 3
Triglycerides: 96 mg/dL (ref 0.0–149.0)
VLDL: 19.2 mg/dL (ref 0.0–40.0)

## 2022-08-26 LAB — POC URINALSYSI DIPSTICK (AUTOMATED)
Bilirubin, UA: NEGATIVE
Blood, UA: NEGATIVE
Glucose, UA: NEGATIVE
Ketones, UA: NEGATIVE
Leukocytes, UA: NEGATIVE
Nitrite, UA: NEGATIVE
Protein, UA: NEGATIVE
Spec Grav, UA: 1.01 (ref 1.010–1.025)
Urobilinogen, UA: 0.2 E.U./dL
pH, UA: 8 (ref 5.0–8.0)

## 2022-08-26 LAB — COMPREHENSIVE METABOLIC PANEL
ALT: 10 U/L (ref 0–35)
AST: 12 U/L (ref 0–37)
Albumin: 4.2 g/dL (ref 3.5–5.2)
Alkaline Phosphatase: 61 U/L (ref 39–117)
BUN: 12 mg/dL (ref 6–23)
CO2: 29 mEq/L (ref 19–32)
Calcium: 9.5 mg/dL (ref 8.4–10.5)
Chloride: 101 mEq/L (ref 96–112)
Creatinine, Ser: 0.86 mg/dL (ref 0.40–1.20)
GFR: 85.54 mL/min (ref >60.00)
Glucose, Bld: 79 mg/dL (ref 70–99)
Potassium: 4.2 mEq/L (ref 3.5–5.1)
Sodium: 140 mEq/L (ref 135–145)
Total Bilirubin: 0.6 mg/dL (ref 0.2–1.2)
Total Protein: 7.4 g/dL (ref 6.0–8.3)

## 2022-08-26 LAB — CBC WITH DIFFERENTIAL/PLATELET
Basophils Absolute: 0.1 10*3/uL (ref 0.0–0.1)
Basophils Relative: 1.3 % (ref 0.0–3.0)
Eosinophils Absolute: 0.1 10*3/uL (ref 0.0–0.7)
Eosinophils Relative: 1.2 % (ref 0.0–5.0)
HCT: 41.9 % (ref 36.0–46.0)
Hemoglobin: 13.9 g/dL (ref 12.0–15.0)
Lymphocytes Relative: 27.3 % (ref 12.0–46.0)
Lymphs Abs: 1.5 10*3/uL (ref 0.7–4.0)
MCHC: 33.1 g/dL (ref 30.0–36.0)
MCV: 93.4 fl (ref 78.0–100.0)
Monocytes Absolute: 0.3 10*3/uL (ref 0.1–1.0)
Monocytes Relative: 5.8 % (ref 3.0–12.0)
Neutro Abs: 3.5 10*3/uL (ref 1.4–7.7)
Neutrophils Relative %: 64.4 % (ref 43.0–77.0)
Platelets: 289 10*3/uL (ref 150.0–400.0)
RBC: 4.49 Mil/uL (ref 3.87–5.11)
RDW: 14.8 % (ref 11.5–15.5)
WBC: 5.5 10*3/uL (ref 4.0–10.5)

## 2022-08-26 LAB — VITAMIN D 25 HYDROXY (VIT D DEFICIENCY, FRACTURES): VITD: 17.56 ng/mL — ABNORMAL LOW (ref 30.00–100.00)

## 2022-08-26 LAB — HEMOGLOBIN A1C: Hgb A1c MFr Bld: 5.4 % (ref 4.6–6.5)

## 2022-08-26 LAB — TSH: TSH: 1.51 u[IU]/mL (ref 0.35–5.50)

## 2022-08-26 MED ORDER — CHOLECALCIFEROL 1.25 MG (50000 UT) PO CAPS: 50000.0000 [IU] | ORAL_CAPSULE | Freq: Every day | ORAL | 0 refills | Status: DC

## 2022-08-26 NOTE — Patient Instructions (Signed)
Good to see you again today.  Please schedule regular follow up with GYN.  Labs and urine check today.  Vit D refilled. Call if needing refills on sertraline.  Referral to healthy weight and wellness.  Please contact your insurance company to see if they approve of any weight loss medications.  You can then notify us of what they will cover and we can try send in, however we cannot guarantee coverage or availability.   Some plans unfortunately do not cover for any obesity medications.   Our office currently does not have the time or staff dedicated to resubmit multiple prior authorizations and appeals for these medications at this time. I'm hoping that the landscape for these medications change soon, but as of right now, this is out of our hands. Please let me know and I will do what I can to help.

## 2022-08-26 NOTE — Progress Notes (Signed)
Subjective:    Patient ID: Faith Hanson, female    DOB: 04-Sep-1985, 37 y.o.   MRN: [id number]  Chief Complaint  Patient presents with   Annual Exam    Pt in office for annual exam and fasting labs; pt not fasting so needs to come back for fasting labs; pt also wanting to start back on Vitamin D pills; pt also wanting to discuss starting back on Adapiex (phentermine) to help with weight loss; pt wanting to discuss checking for UTI, symptoms since surgery for tubes clamped    HPI Patient is in today for annual exam.  Acute concerns: Weight concerns, urinary frequency, Vit D   Health maintenance: Lifestyle/ exercise: Working on increasing this  Nutrition: Meal-prep daily Mental health: Stable with sertraline 50 mg daily Caffeine: 1 cup daily  Sleep: Doing well overall  Substance use: None  Sexual activity: Monogamous, no concerns  Immunizations: UTD  Colonoscopy: She had one in her past from prior issues, otherwise no concerns.  Pap: UTD with her GYN    Past Medical History:  Diagnosis Date   Medical history non-contributory    Vaginal Pap smear, abnormal     Past Surgical History:  Procedure Laterality Date   CESAREAN SECTION     LEEP      Family History  Problem Relation Age of Onset   Diabetes Maternal Grandmother    Hyperlipidemia Mother    Obesity Mother    Anemia Sister    Thyroid disease Sister    Colon cancer Maternal Aunt     Social History   Tobacco Use   Smoking status: Never   Smokeless tobacco: Never  Substance Use Topics   Alcohol use: Yes    Comment: rarely    Drug use: No     No Known Allergies  Review of Systems NEGATIVE UNLESS OTHERWISE INDICATED IN HPI      Objective:     BP 112/78 (BP Location: Left Arm)   Pulse 88   Temp 97.7 F (36.5 C) (Temporal)   Ht 5' 7"$  (1.702 m)   Wt 233 lb 12.8 oz (106.1 kg)   SpO2 98%   BMI 36.62 kg/m   Wt Readings from Last 3 Encounters:  08/26/22 233 lb 12.8 oz (106.1 kg)  10/01/20  235 lb (106.6 kg)  04/05/19 215 lb 9.6 oz (97.8 kg)    BP Readings from Last 3 Encounters:  08/26/22 112/78  10/01/20 114/71  04/05/19 118/78     Physical Exam Vitals and nursing note reviewed.  Constitutional:      Appearance: Normal appearance. She is obese. She is not toxic-appearing.  HENT:     Head: Normocephalic and atraumatic.     Right Ear: Tympanic membrane, ear canal and external ear normal.     Left Ear: Tympanic membrane, ear canal and external ear normal.     Nose: Nose normal.     Mouth/Throat:     Mouth: Mucous membranes are moist.  Eyes:     Extraocular Movements: Extraocular movements intact.     Conjunctiva/sclera: Conjunctivae normal.     Pupils: Pupils are equal, round, and reactive to light.  Cardiovascular:     Rate and Rhythm: Normal rate and regular rhythm.     Pulses: Normal pulses.     Heart sounds: Normal heart sounds.  Pulmonary:     Effort: Pulmonary effort is normal.     Breath sounds: Normal breath sounds.  Musculoskeletal:  General: Normal range of motion.     Cervical back: Normal range of motion and neck supple.  Skin:    General: Skin is warm and dry.  Neurological:     General: No focal deficit present.     Mental Status: She is alert and oriented to person, place, and time.  Psychiatric:        Mood and Affect: Mood normal.        Behavior: Behavior normal.        Assessment & Plan:  Encounter for annual physical exam Assessment & Plan: Age-appropriate screening and counseling performed today. Will check labs and call with results. Preventive measures discussed and printed in AVS for patient.   Patient Counseling: [x]$   Nutrition: Stressed importance of moderation in sodium/caffeine intake, saturated fat and cholesterol, caloric balance, sufficient intake of fresh fruits, vegetables, and fiber.  [x]$   Stressed the importance of regular exercise.   [x]$   Substance Abuse: Discussed cessation/primary prevention of tobacco,  alcohol, or other drug use; driving or other dangerous activities under the influence; availability of treatment for abuse.   []$   Injury prevention: Discussed safety belts, safety helmets, smoke detector, smoking near bedding or upholstery.   []$   Sexuality: Discussed sexually transmitted diseases, partner selection, use of condoms, avoidance of unintended pregnancy  and contraceptive alternatives.   [x]$   Dental health: Discussed importance of regular tooth brushing, flossing, and dental visits.  [x]$   Health maintenance and immunizations reviewed. Please refer to Health maintenance section.      Orders: -     POCT Urinalysis Dipstick (Automated) -     CBC with Differential/Platelet -     Comprehensive metabolic panel -     Lipid panel -     TSH -     VITAMIN D 25 Hydroxy (Vit-D Deficiency, Fractures) -     Hemoglobin A1c  Anxiety and depression Assessment & Plan: Stable on Zoloft 50 mg daily    Vitamin D deficiency Assessment & Plan: Recheck labs Start back on 50,000 iu once weekly x 12 weeks   Orders: -     VITAMIN D 25 Hydroxy (Vit-D Deficiency, Fractures)  Class 2 obesity due to excess calories without serious comorbidity with body mass index (BMI) of 36.0 to 36.9 in adult Assessment & Plan: Referral placed to [facility] Advised to contact her insurance about coverage of wt loss meds   Orders: -     Amb Ref to Medical Weight Management  Subjective change in urination -     POCT Urinalysis Dipstick (Automated)  Other orders -     Cholecalciferol; Take 1 capsule (50,000 Units total) by mouth daily.  Dispense: 12 capsule; Refill: 0        Return in about 1 year (around 08/27/2023) for CPE, fasting labs .  This note was prepared with assistance of Systems analyst. Occasional wrong-word or sound-a-like substitutions may have occurred due to the inherent limitations of voice recognition software.    Samary Shatz M Lawren Sexson, PA-C

## 2022-08-30 NOTE — Assessment & Plan Note (Signed)
Age-appropriate screening and counseling performed today. Will check labs and call with results. Preventive measures discussed and printed in AVS for patient.   Patient Counseling: [x]$   Nutrition: Stressed importance of moderation in sodium/caffeine intake, saturated fat and cholesterol, caloric balance, sufficient intake of fresh fruits, vegetables, and fiber.  [x]$   Stressed the importance of regular exercise.   [x]$   Substance Abuse: Discussed cessation/primary prevention of tobacco, alcohol, or other drug use; driving or other dangerous activities under the influence; availability of treatment for abuse.   []$   Injury prevention: Discussed safety belts, safety helmets, smoke detector, smoking near bedding or upholstery.   []$   Sexuality: Discussed sexually transmitted diseases, partner selection, use of condoms, avoidance of unintended pregnancy  and contraceptive alternatives.   [x]$   Dental health: Discussed importance of regular tooth brushing, flossing, and dental visits.  [x]$   Health maintenance and immunizations reviewed. Please refer to Health maintenance section.

## 2022-08-30 NOTE — Assessment & Plan Note (Signed)
Recheck labs Start back on 50,000 iu once weekly x 12 weeks

## 2022-08-30 NOTE — Assessment & Plan Note (Signed)
Stable on Zoloft 50 mg daily. 

## 2022-08-30 NOTE — Assessment & Plan Note (Signed)
Referral placed to [facility] Advised to contact her insurance about coverage of wt loss meds

## 2022-09-03 ENCOUNTER — Other Ambulatory Visit: Payer: Self-pay

## 2022-09-03 DIAGNOSIS — E559 Vitamin D deficiency, unspecified: Secondary | ICD-10-CM

## 2022-09-03 MED ORDER — CHOLECALCIFEROL 1.25 MG (50000 UT) PO CAPS: ORAL_CAPSULE | ORAL | 0 refills | Status: AC

## 2023-08-05 LAB — HM PAP SMEAR

## 2024-04-12 ENCOUNTER — Ambulatory Visit: Admitting: Physician Assistant

## 2024-04-12 VITALS — BP 110/74 | HR 94 | Temp 98.2°F | Ht 67.0 in | Wt 245.6 lb

## 2024-04-12 DIAGNOSIS — L503 Dermatographic urticaria: Secondary | ICD-10-CM | POA: Diagnosis not present

## 2024-04-12 MED ORDER — PREDNISONE 20 MG PO TABS: 40.0000 mg | ORAL_TABLET | Freq: Every day | ORAL | 0 refills | Status: AC

## 2024-04-12 NOTE — Patient Instructions (Signed)
  VISIT SUMMARY: You visited us  today due to chronic hives and dermographism that you've been experiencing for the past four to six months. We discussed your symptoms, current medications, and potential triggers.  YOUR PLAN: CHRONIC URTICARIA (DERMOGRAPHISM): You have been experiencing chronic hives and dermographism, which are raised welts on your skin that sometimes feel inflamed and hot. This condition has been ongoing for 4-6 months and is likely triggered by a new body wash. -We are referring you to an allergy specialist in [facility] for further management. -Switch from Zyrtec (cetirizine) to Claritin (loratadine) 10 mg twice daily. -We are prescribing a 5-day course of 40 mg prednisone  to be taken with breakfast. -Monitor for side effects of steroids, including irritability, increased appetite, and sweating. -Please send photos of your hives via MyChart for documentation. -Monitor for triggers such as heat and stress.                      Contains text generated by [employer].                                 Contains text generated by [employer].

## 2024-04-12 NOTE — Progress Notes (Signed)
 Patient ID: Faith Hanson, female    DOB: March 14, 1986, 38 y.o.   MRN: [id number]   Assessment & Plan:  Dermatographia -     Ambulatory referral to Allergy  Other orders -     predniSONE ; Take 2 tablets (40 mg total) by mouth daily with breakfast for 5 days.  Dispense: 10 tablet; Refill: 0      Assessment and Plan Assessment & Plan Chronic urticaria (dermographism) Chronic urticaria with dermographism persisting for 4-6 months, characterized by raised wheals, hives, and facial inflammation, exacerbated at night. Likely triggered by a new body wash, not autoimmune-related. - Refer to allergy specialist in [facility] for further management. - Switch from Zyrtec (cetirizine) to Claritin (loratadine) 10 mg twice daily. - Prescribe a 5-day course of 40 mg prednisone  with breakfast. - Monitor for side effects of steroids, including irritability, increased appetite, and sweating. - Advise to send photos of urticaria via MyChart for documentation. - Advise to monitor for triggers such as heat and stress.      Return if symptoms worsen or fail to improve.    Subjective:    Chief Complaint  Patient presents with   Rash    Pt in office to discuss skin issues, constant itching and burning sensation, pt researched dermatographia and would like to discuss; feeling like beyond allergic reaction since it has been so long and started with a body wash she used 4-6 months ago;     Rash   Discussed the use of AI scribe software for clinical note transcription with the patient, who gave verbal consent to proceed.  History of Present Illness Faith Hanson is a 38 year old female who presents with chronic hives and dermographism.  She has been experiencing chronic hives and dermographism for the past four to six months. The hives are raised welts that appear on her skin, including her face, and sometimes feel inflamed and hot. Symptoms began after using a strongly scented body  wash, which she has since discontinued.  She takes Zyrtec (cetirizine) daily, which effectively manages her symptoms. Skipping a dose results in irritability, burning sensations, and itchiness, particularly at night. She has also used fexofenadine, which provided relief during a trip to [facility]. She has not tried Claritin (loratadine), but has used fexofenadine [employer]), which provided relief during a trip to [facility]. She has not used steroids before and is curious about their effects.  The hives can appear at any time on her body, with symptoms flaring up more at night. No significant flare-ups occur during the day when she takes her allergy medication in the morning. The condition has impacted her daily life, as she manages work and family responsibilities.     Past Medical History:  Diagnosis Date   Medical history non-contributory    Vaginal Pap smear, abnormal     Past Surgical History:  Procedure Laterality Date   CESAREAN SECTION     LEEP      Family History  Problem Relation Age of Onset   Diabetes Maternal Grandmother    Hyperlipidemia Mother    Obesity Mother    Anemia Sister    Thyroid  disease Sister    Colon cancer Maternal Aunt     Social History   Tobacco Use   Smoking status: Never   Smokeless tobacco: Never  Substance Use Topics   Alcohol use: Yes    Comment: rarely    Drug use: No     No Known Allergies  Review of Systems  Skin:  Positive for rash.   NEGATIVE UNLESS OTHERWISE INDICATED IN HPI      Objective:     BP 110/74 (BP Location: Left Arm, Patient Position: Sitting, Cuff Size: Normal)   Pulse 94   Temp 98.2 F (36.8 C) (Temporal)   Ht 5' 7 (1.702 m)   Wt 245 lb 9.6 oz (111.4 kg)   SpO2 98%   BMI 38.47 kg/m   Wt Readings from Last 3 Encounters:  04/12/24 245 lb 9.6 oz (111.4 kg)  08/26/22 233 lb 12.8 oz (106.1 kg)  10/01/20 235 lb (106.6 kg)    BP Readings from Last 3 Encounters:  04/12/24 110/74  08/26/22 112/78   10/01/20 114/71     Physical Exam Vitals and nursing note reviewed.  Constitutional:      Appearance: Normal appearance. She is obese.  Eyes:     Extraocular Movements: Extraocular movements intact.     Conjunctiva/sclera: Conjunctivae normal.     Pupils: Pupils are equal, round, and reactive to light.  Cardiovascular:     Rate and Rhythm: Normal rate.     Pulses: Normal pulses.     Heart sounds: No murmur heard. Pulmonary:     Effort: Pulmonary effort is normal.  Skin:    Findings: No rash (no current rash today - she will send mychart photos).  Neurological:     General: No focal deficit present.     Mental Status: She is alert.  Psychiatric:        Mood and Affect: Mood normal.             Brondon Wann M Dylynn Ketner, PA-C

## 2024-05-04 ENCOUNTER — Telehealth: Payer: Self-pay | Admitting: Physician Assistant

## 2024-05-04 NOTE — Telephone Encounter (Signed)
 LVM and mailed Letter to Reschedule 05/24/24 appointment (Provider out of office)

## 2024-05-24 ENCOUNTER — Encounter: Admitting: Physician Assistant

## 2024-08-22 ENCOUNTER — Telehealth: Payer: Self-pay | Admitting: Neurology

## 2024-08-22 NOTE — Telephone Encounter (Signed)
 Received referral for sleep from Darice Bitter, NP at [facility], placed in Alia's box

## 2024-09-19 ENCOUNTER — Ambulatory Visit: Admitting: Allergy
